# Patient Record
Sex: Female | Born: 1965 | Race: White | Hispanic: No | Marital: Married | Smoking: Never smoker
Health system: Southern US, Community
[De-identification: ages and names within clinical notes are randomized; demographics above are authoritative.]

## PROBLEM LIST (undated history)

## (undated) DIAGNOSIS — Z9889 Other specified postprocedural states: Secondary | ICD-10-CM

## (undated) DIAGNOSIS — K219 Gastro-esophageal reflux disease without esophagitis: Secondary | ICD-10-CM

## (undated) DIAGNOSIS — R112 Nausea with vomiting, unspecified: Secondary | ICD-10-CM

## (undated) DIAGNOSIS — J45909 Unspecified asthma, uncomplicated: Secondary | ICD-10-CM

## (undated) DIAGNOSIS — D649 Anemia, unspecified: Secondary | ICD-10-CM

## (undated) DIAGNOSIS — E119 Type 2 diabetes mellitus without complications: Secondary | ICD-10-CM

## (undated) DIAGNOSIS — E039 Hypothyroidism, unspecified: Secondary | ICD-10-CM

## (undated) DIAGNOSIS — E079 Disorder of thyroid, unspecified: Secondary | ICD-10-CM

## (undated) HISTORY — PX: OTHER SURGICAL HISTORY: SHX169

## (undated) HISTORY — PX: COLONOSCOPY: SHX174

## (undated) HISTORY — PX: BACK SURGERY: SHX140

## (undated) HISTORY — PX: FOOT SURGERY: SHX648

## (undated) HISTORY — PX: DG FOREARM RIGHT (ARMC HX): HXRAD1525

## (undated) HISTORY — PX: TONSILLECTOMY: SUR1361

---

## 2001-12-28 ENCOUNTER — Encounter: Payer: Self-pay | Admitting: Neurosurgery

## 2001-12-28 ENCOUNTER — Ambulatory Visit (HOSPITAL_COMMUNITY): Admission: RE | Admit: 2001-12-28 | Discharge: 2001-12-29 | Payer: Self-pay | Admitting: Neurosurgery

## 2006-10-11 ENCOUNTER — Emergency Department: Payer: Self-pay | Admitting: Emergency Medicine

## 2006-10-26 ENCOUNTER — Encounter: Admission: RE | Admit: 2006-10-26 | Discharge: 2006-10-26 | Payer: Self-pay | Admitting: Neurological Surgery

## 2006-10-27 ENCOUNTER — Ambulatory Visit: Payer: Self-pay

## 2007-01-11 ENCOUNTER — Ambulatory Visit: Payer: Self-pay | Admitting: *Deleted

## 2007-01-11 ENCOUNTER — Inpatient Hospital Stay (HOSPITAL_COMMUNITY): Admission: RE | Admit: 2007-01-11 | Discharge: 2007-01-15 | Payer: Self-pay | Admitting: Neurological Surgery

## 2007-10-28 ENCOUNTER — Ambulatory Visit: Payer: Self-pay

## 2008-11-23 ENCOUNTER — Ambulatory Visit: Payer: Self-pay

## 2009-08-16 ENCOUNTER — Ambulatory Visit: Payer: Self-pay | Admitting: Internal Medicine

## 2009-10-11 ENCOUNTER — Ambulatory Visit: Payer: Self-pay | Admitting: Unknown Physician Specialty

## 2009-10-16 ENCOUNTER — Ambulatory Visit: Payer: Self-pay | Admitting: Unknown Physician Specialty

## 2009-12-20 ENCOUNTER — Ambulatory Visit: Payer: Self-pay

## 2010-06-18 NOTE — Op Note (Signed)
[name], [name]                 ACCOUNT NO.:  [id number]   MEDICAL RECORD NO.:  [id number]          PATIENT TYPE:  INP   LOCATION:  3172                         FACILITY:  [facility]   PHYSICIAN:  Balinda Quails, M.D.    DATE OF BIRTH:  1965/10/13   DATE OF PROCEDURE:  01/11/2007  DATE OF DISCHARGE:                               OPERATIVE REPORT   SURGEON:  Denman George, M.D.   CO-SURGEON:  Stefani Dama M.D.   ANESTHETIC:  Is general endotracheal.   PREOPERATIVE DIAGNOSIS:  L5-S1 degenerative disk disease.   POSTOPERATIVE DIAGNOSIS:  L5-S1 degenerative disk disease.   PROCEDURE:  Right anterior retroperitoneal exposure L5-S1 for L5-S1  anterior lumbar interbody fusion (ALIF).   CLINICAL NOTE:  Suzanne Munoz is a 46 year old female with chronic back  pain and L5-S1 degenerative disk disease.  Scheduled today to undergo L5-  S1 ALIF.  The patient was seen preoperatively in the holding area.  No  contraindications to surgery.  No history of DVT or pulmonary embolus.  No history of major abdominal surgery.  Two plus dorsalis pedis and  posterior tibial pulses present preoperatively.   The patient was explained the details of the operative procedure.  Potential risks of the operative procedure were reviewed with a major  complication rated 1-2%.  Complications include but are not limited to  major vessel injury, thrombosis, limb ischemia, DVT or pulmonary  embolus, ureter injury, nerve injury, and transfusion risk.   OPERATIVE PROCEDURE:  The patient brought to the operating room in  stable condition.  Placed under general endotracheal anesthesia.  Pulse  oximetry placed on the left foot.  Measured 100% throughout the  operative procedure.   In the supine position, the abdomen was prepped and draped in sterile  fashion.  Using lateral projection fluoroscopy, L5-S1 level was marked  on the anterior abdominal wall.   Right lower quadrant transverse skin incision made.   Subcutaneous tissue  by electrocautery.  Anterior rectus sheath incised from midline to  lateral margin of the right rectus muscle.  Superior and inferior  anterior rectus sheath flaps were created.  The rectus muscle mobilized  medially, taking along the inferior epigastric vessels.  Retroperitoneal  space entered.  The psoas muscle and genitofemoral nerve were  identified, nerve left on the muscle.  The left external and common  iliac arteries identified.  These were cleared bluntly, sweeping the  structures medially including the ureter.  The L5-S1 disk space was  easily palpated.  The presacral tissues were bluntly pushed off of the  L5-S1 disk.  The fascial plane anterior to the disk was scored with  Bovie cautery.  Blunt dissection then used to push the structures off  the disk from right to left.  The small middle sacral vessels were  controlled with bipolar cautery.  The disk fully exposed from right-to-  left.   The [facility] retractor was then assembled, and using reversed lip  blades on the lateral margins of the L5-S1 vertebral bodies, disk was  exposed.  Malleable retractor was placed inferiorly and superiorly.  The disk was fully exposed for Dr. Danielle Dess to complete ALIF.  At  completion of ALIF, retractors were removed.  There was no evidence of  bleeding.  Pulse oximetry remained 100% in left foot.   The anterior rectus sheath was then closed with running 0 Vicryl suture.  Deep subcutaneous layer closed with running 2-0 Vicryl suture.  Superficial subcutaneous layer with running 3-0 Vicryl suture.  Skin  closed with 4-0 Monocryl.  Dermabond applied.   At termination of the operative procedure, there were 2+ posterior  tibial pulses present bilaterally.  No apparent complications.  The  patient remained stable throughout the operative procedure.  Transferred  to recovery room in stable condition.      Balinda Quails, M.D.  Electronically Signed     PGH/MEDQ   D:  01/11/2007  T:  01/11/2007  Job:  [id number]

## 2010-06-18 NOTE — Op Note (Signed)
Suzanne, Munoz                 ACCOUNT NO.:  [id number]   MEDICAL RECORD NO.:  [id number]          PATIENT TYPE:  INP   LOCATION:  3172                         FACILITY:  [facility]   PHYSICIAN:  Stefani Dama, M.D.  DATE OF BIRTH:  1965-06-24   DATE OF PROCEDURE:  01/11/2007  DATE OF DISCHARGE:                               OPERATIVE REPORT   PREOPERATIVE DIAGNOSIS:  Recurrent herniated nucleus pulposus, L5-S1,  with left lumbar radiculopathy.   POSTOPERATIVE DIAGNOSES:  1. Recurrent herniated nucleus pulposus, L5-S1, with left lumbar      radiculopathy.  2. Spondylosis, L5-S1.   INDICATIONS FOR PROCEDURE:  Suzanne Munoz is a 45 year old individual  who has had significant previous back and left lower extremity pain.  She has evidence of recurrent extraforaminal disk protrusion at the L5-  S1 level.  She has failed previous attempts at conservative management  and had a laminectomy and diskectomy for L5-S1 disk herniation in 2003.  She has been advised regarding anterior lumbar interbody decompression  and arthrodesis.  This procedure is now be performed.   PROCEDURE:  The patient was brought to the operating room and placed on  the table in the supine position.  After the smooth induction of general  endotracheal anesthesia, the procedure was started by Dr. Liliane Bade who  performed an anterior retroperitoneal approach to the L5-S1 space.  He  will dictate that portion separately.  When the anterior space of L5-S1  was confirmed radiographically with the needle placed in the disk, then  a diskectomy was performed.  Prior to the diskectomy, a bone marrow  aspirate was obtained from the L5 vertebra using the Jamshidi needle  being placed into the center portion of the vertebra and aspirating a  total of 10 mL of bone marrow aspirate.  The needle was withdrawn.  Gelfoam was placed over the bleeding site, and the disk space was then  opened with a #15 blade and a combination of  Kerrison rongeurs was used  to evacuate substantially degenerated disk at the L5-S1 space.  As the  region of the posterior longitudinal ligament was reached on the left  side, there was noted to be significant scar.  This was taken up very  carefully, and ultimately the dura was exposed on the left lateral  aspect of the common dural tube and the takeoff of the L5 nerve root was  also exposed.  Scar was encountered in this area and this was carefully  separated to allow decompression of the lateral aspect of the L5 nerve  root and the common dural tube.  Once this was accomplished, hemostasis  was achieved in this area.  The disk was removed out to the right side  and decompression here was obtained but the ligament was left intact.  Hemostasis was then obtained in the epidural space, and a high-speed bur  was used to fashion the endplates and remove any remnants of  cartilaginous material from the endplates themselves.  A curette was  also used to roughen the endplates and make sure that there was good  bleeding bony contact from the subcortical bone in the midportion of the  disk space.  Then a trial spacer was used, and it was felt that a 13-mm  tall, 30 x 38-mm 12-degree spacer would fit best.  This was then  prepared with a small amount of Infuse was placed into the center of the  spacer, and either side of the spacer was packed with Vitoss bone pack  that was soaked with the bone marrow aspirate.  The spacer was then  placed into the squid and the squid was used to apply the spacer into  the interspace at the L5-S1 space.  Fluoroscopy was obtained to make  sure that there was good placement of the spacer in the midline.  It  required some modification and movement once the midline was secured,  and the spacer was placed.  A 12 and 4 20-mm locking screws were placed  into the anterior titanium plate, first by passing in the awl to  perforate the bony endplate and then placing the  screws and locking them  down sequentially.  Final radiographs were obtained in the AP and  lateral projection.  Remnants of  the bone graft were then packed into the lateral gutters.  Hemostasis  was checked in the soft tissues, and the retractors were sequentially  removed.  Dr. Madilyn Fireman then performed closure of the fascia to the skin.  Blood loss for the procedure was estimated at less than 100 mL.      Stefani Dama, M.D.  Electronically Signed     HJE/MEDQ  D:  01/11/2007  T:  01/11/2007  Job:  [id number]

## 2010-06-21 NOTE — Op Note (Signed)
NAMECORTNIE, Suzanne Munoz                           ACCOUNT NO.:  [id number]   MEDICAL RECORD NO.:  [id number]                   PATIENT TYPE:  OIB   LOCATION:  NA                                   FACILITY:  [facility]   PHYSICIAN:  Reinaldo Meeker, M.D.              DATE OF BIRTH:  [birth year]   DATE OF PROCEDURE:  12/28/2001  DATE OF DISCHARGE:                                 OPERATIVE REPORT   PREOPERATIVE DIAGNOSIS:  Herniated disk, L5-S1 left.   POSTOPERATIVE DIAGNOSIS:  Herniated disk, L5-S1 left.   OPERATION PERFORMED:  Left L5-S1 interlaminal laminotomy for excision of  herniated disk with the operating microscope.   SECONDARY PROCEDURE:  Microdissection of L5-S1 disk and S1 nerve root.   SURGEON:  Reinaldo Meeker, M.D.   ASSISTANT:  Donalee Citrin, M.D.   DESCRIPTION OF PROCEDURE:  After being placed in the prone position, the  patient's back was prepped and draped in the usual sterile fashion.  A  localizing x-ray was taken prior to incision to identify the appropriate  level.  A midline incision was made above the spinous processes of L5 and  S1.  Using Bovie cutting current, the incision was carried down to the  spinous processes.  Subperiosteal dissection was then carried out on the  left sided spinous processes and lamina and the McCullough self-retaining  retractor was placed for exposure.  A second x-ray was taken to confirm  approach to the L5-S1 level and this was correct.  Using a high speed drill  the inferior one third of the L5 lamina and the medial one third of the  facet joint removed.  It was then used to remove the superior one third of  the S1 lamina.  Residual bone and ligamentum flavum were removed in a  piecemeal fashion.  The microscope was draped and brought into the field and  used for the remainder of the case.  Using microdissection technique, the  lateral aspect of the thecal sac and S1 nerve were identified.  Bovie  coagulation was carried down to the  floor of the canal to identify the L5-S1  disk.  After slightly retracting the nerve root medially, a very large  fragment of disk material was found directly beneath the nerve root.  The  annulus was incised and the fragment was removed.  At this point a very  thorough disk space clean out was carried out.  At the same time great care  was taken to avoid injury to the neural elements and this was successfully  done.  At this point inspection was carried out in all directions for any  evidence of residual compression and none could be identified.  Large  amounts of irrigation were carried out.  Any bleeding was controlled with  bipolar coagulation and Gelfoam.  The wound was then closed using  interrupted Vicryl on the muscle, fascia, subcutaneous  and subcuticular  tissues and staples on the skin.  Sterile dressing was then applied.  The  patient was extubated and taken to the recovery room in stable condition.                                                 Reinaldo Meeker, M.D.    ROK/MEDQ  D:  12/28/2001  T:  12/28/2001  Job:  [id number]

## 2010-06-21 NOTE — Discharge Summary (Signed)
NAMEJAEANNA, Suzanne Munoz                 ACCOUNT NO.:  [id number]   MEDICAL RECORD NO.:  [id number]          PATIENT TYPE:  INP   LOCATION:  3006                         FACILITY:  [facility]   PHYSICIAN:  Stefani Dama, M.D.  DATE OF BIRTH:  March 04, 1965   DATE OF ADMISSION:  01/11/2007  DATE OF DISCHARGE:  01/15/2007                               DISCHARGE SUMMARY   ADMITTING DIAGNOSIS:  Herniated nucleus pulposus and spondylosis, L5-S1,  recurrent with lumbar radiculopathy.   DISCHARGE AND FINAL DIAGNOSIS:  1. Herniated nucleus pulposus, L5-S1, recurrent with lumbar      radiculopathy.  2. Postoperative ileus.   CONDITION ON DISCHARGE:  Improving.   HOSPITAL COURSE:  Suzanne Munoz is a 45 year old individual who was had  recurrent herniated nucleus pulposus at L5-S1.  She had significant  difficulty with chronic radiculopathy and was failing conservative  efforts at management.  She was advised regarding need for surgical  decompression and stabilization, and this was performed on January 11, 2007 via an anterior lumbar interbody decompression arthrodesis.  She  tolerated the procedure well.  However, during the early postoperative  phase, it was evident that she had a paralytic ileus.  This was treated  by maintaining her on IV fluids and maintaining her n.p.o.  She was  ambulated, and after the third postoperative day, her bowels moved, and  she had resolution of her ileus.  She was given instructions as to her  postoperative care.  Foley catheter was discontinued after the second  hospital day.  The patient's incision remained clean and dry.  She was  given a prescription for hydrocodone #60 without refills and Valium 5 mg  #40 without refills.  She also will be giving herself injections of  Lovenox 40 mg q.a.m. for additional 6 days.   CONDITION ON DISCHARGE:  Improved.      Stefani Dama, M.D.  Electronically Signed     HJE/MEDQ  D:  02/11/2007  T:  02/11/2007  Job:   [id number]

## 2010-11-11 LAB — CBC
HCT: 33.6 — ABNORMAL LOW
HCT: 40.5
Hemoglobin: 11.7 — ABNORMAL LOW
Hemoglobin: 14
MCHC: 34.5
MCHC: 34.9
MCV: 85.9
Platelets: 296
RDW: 12.4
RDW: 12.5

## 2010-11-11 LAB — BASIC METABOLIC PANEL
BUN: 15
BUN: 9
CO2: 24
CO2: 27
Chloride: 108
Glucose, Bld: 118 — ABNORMAL HIGH
Glucose, Bld: 191 — ABNORMAL HIGH
Potassium: 4.1
Potassium: 4.6
Sodium: 137
Sodium: 139

## 2010-12-31 ENCOUNTER — Ambulatory Visit: Payer: Self-pay

## 2011-03-20 DIAGNOSIS — IMO0001 Reserved for inherently not codable concepts without codable children: Secondary | ICD-10-CM | POA: Insufficient documentation

## 2011-06-23 ENCOUNTER — Emergency Department: Payer: Self-pay | Admitting: Emergency Medicine

## 2011-06-23 LAB — COMPREHENSIVE METABOLIC PANEL
BUN: 19 mg/dL — ABNORMAL HIGH (ref 7–18)
Bilirubin,Total: 0.3 mg/dL (ref 0.2–1.0)
Calcium, Total: 8.9 mg/dL (ref 8.5–10.1)
Chloride: 105 mmol/L (ref 98–107)
Creatinine: 1.09 mg/dL (ref 0.60–1.30)
Glucose: 118 mg/dL — ABNORMAL HIGH (ref 65–99)
Osmolality: 281 (ref 275–301)
SGPT (ALT): 43 U/L
Sodium: 139 mmol/L (ref 136–145)
Total Protein: 7.3 g/dL (ref 6.4–8.2)

## 2011-06-23 LAB — CBC
HCT: 36.7 % (ref 35.0–47.0)
Platelet: 269 10*3/uL (ref 150–440)
RBC: 4.36 10*6/uL (ref 3.80–5.20)

## 2011-09-15 ENCOUNTER — Ambulatory Visit: Payer: Self-pay | Admitting: Unknown Physician Specialty

## 2012-01-05 ENCOUNTER — Ambulatory Visit: Payer: Self-pay

## 2012-06-23 ENCOUNTER — Ambulatory Visit: Payer: Self-pay | Admitting: Family Medicine

## 2013-01-05 ENCOUNTER — Ambulatory Visit: Payer: Self-pay | Admitting: Obstetrics and Gynecology

## 2013-05-12 DIAGNOSIS — E119 Type 2 diabetes mellitus without complications: Secondary | ICD-10-CM | POA: Insufficient documentation

## 2013-05-12 DIAGNOSIS — E039 Hypothyroidism, unspecified: Secondary | ICD-10-CM | POA: Insufficient documentation

## 2013-05-12 DIAGNOSIS — E785 Hyperlipidemia, unspecified: Secondary | ICD-10-CM | POA: Insufficient documentation

## 2013-05-12 DIAGNOSIS — IMO0001 Reserved for inherently not codable concepts without codable children: Secondary | ICD-10-CM | POA: Insufficient documentation

## 2013-05-12 DIAGNOSIS — K589 Irritable bowel syndrome without diarrhea: Secondary | ICD-10-CM | POA: Insufficient documentation

## 2013-05-12 DIAGNOSIS — R12 Heartburn: Secondary | ICD-10-CM | POA: Insufficient documentation

## 2013-07-21 DIAGNOSIS — M7062 Trochanteric bursitis, left hip: Secondary | ICD-10-CM | POA: Insufficient documentation

## 2013-07-21 DIAGNOSIS — M5116 Intervertebral disc disorders with radiculopathy, lumbar region: Secondary | ICD-10-CM | POA: Insufficient documentation

## 2013-07-21 DIAGNOSIS — M5136 Other intervertebral disc degeneration, lumbar region: Secondary | ICD-10-CM | POA: Insufficient documentation

## 2013-07-21 DIAGNOSIS — M5416 Radiculopathy, lumbar region: Secondary | ICD-10-CM | POA: Insufficient documentation

## 2013-07-21 DIAGNOSIS — M51369 Other intervertebral disc degeneration, lumbar region without mention of lumbar back pain or lower extremity pain: Secondary | ICD-10-CM | POA: Insufficient documentation

## 2013-10-19 DIAGNOSIS — E611 Iron deficiency: Secondary | ICD-10-CM | POA: Insufficient documentation

## 2013-10-19 DIAGNOSIS — E538 Deficiency of other specified B group vitamins: Secondary | ICD-10-CM | POA: Insufficient documentation

## 2013-10-19 DIAGNOSIS — E669 Obesity, unspecified: Secondary | ICD-10-CM | POA: Insufficient documentation

## 2013-12-20 DIAGNOSIS — M169 Osteoarthritis of hip, unspecified: Secondary | ICD-10-CM | POA: Insufficient documentation

## 2013-12-20 DIAGNOSIS — M1612 Unilateral primary osteoarthritis, left hip: Secondary | ICD-10-CM | POA: Insufficient documentation

## 2014-02-21 ENCOUNTER — Ambulatory Visit: Payer: Self-pay | Admitting: Obstetrics and Gynecology

## 2014-10-19 DIAGNOSIS — M25559 Pain in unspecified hip: Secondary | ICD-10-CM | POA: Insufficient documentation

## 2014-10-19 DIAGNOSIS — M79605 Pain in left leg: Secondary | ICD-10-CM | POA: Insufficient documentation

## 2014-10-19 DIAGNOSIS — R102 Pelvic and perineal pain: Secondary | ICD-10-CM | POA: Insufficient documentation

## 2014-12-19 DIAGNOSIS — E119 Type 2 diabetes mellitus without complications: Secondary | ICD-10-CM | POA: Insufficient documentation

## 2015-02-09 ENCOUNTER — Encounter: Payer: Self-pay | Admitting: Sports Medicine

## 2015-02-09 ENCOUNTER — Ambulatory Visit (INDEPENDENT_AMBULATORY_CARE_PROVIDER_SITE_OTHER): Payer: BLUE CROSS/BLUE SHIELD | Admitting: Sports Medicine

## 2015-02-09 ENCOUNTER — Ambulatory Visit (INDEPENDENT_AMBULATORY_CARE_PROVIDER_SITE_OTHER): Payer: BLUE CROSS/BLUE SHIELD

## 2015-02-09 VITALS — BP 153/78 | HR 65 | Resp 16

## 2015-02-09 DIAGNOSIS — E119 Type 2 diabetes mellitus without complications: Secondary | ICD-10-CM

## 2015-02-09 DIAGNOSIS — M779 Enthesopathy, unspecified: Secondary | ICD-10-CM | POA: Diagnosis not present

## 2015-02-09 DIAGNOSIS — M79675 Pain in left toe(s): Secondary | ICD-10-CM | POA: Diagnosis not present

## 2015-02-09 DIAGNOSIS — M205X2 Other deformities of toe(s) (acquired), left foot: Secondary | ICD-10-CM | POA: Diagnosis not present

## 2015-02-09 MED ORDER — TRIAMCINOLONE ACETONIDE 10 MG/ML IJ SUSP: 10.0000 mg | Freq: Once | INTRAMUSCULAR | Status: DC

## 2015-02-09 NOTE — Progress Notes (Signed)
Patient ID: Suzanne Munoz, female   DOB: 07/15/1965, 49 y.o.   MRN: [id number] Subjective: Suzanne Munoz is a 49 y.o. diabetic female patient who presents to office for evaluation of Left big toe joint pain. Patient complains of progressive pain/throbbing especially over the last few months that is worse with motion and when she wears sneakers. 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 Tried Ibuprofen. Patient denies any other pedal complaints.   A1c 7.5  Patient Active Problem List   Diagnosis Date Noted  . Controlled type 2 diabetes mellitus without complication (HCC) 12/19/2014  . Arthralgia of hip 10/19/2014  . Left leg pain 10/19/2014  . Adnexal pain 10/19/2014  . Degenerative arthritis of hip 12/20/2013  . B12 deficiency 10/19/2013  . Iron deficiency 10/19/2013  . Adiposity 10/19/2013  . Degeneration of intervertebral disc of lumbar region 07/21/2013  . Neuritis or radiculitis due to rupture of lumbar intervertebral disc 07/21/2013  . Trochanteric bursitis of left hip 07/21/2013  . Diabetes mellitus (HCC) 05/12/2013  . Brash 05/12/2013  . HLD (hyperlipidemia) 05/12/2013  . Adult hypothyroidism 05/12/2013  . Adaptive colitis 05/12/2013  . Reflux 03/20/2011   No current outpatient prescriptions on file prior to visit.   No current facility-administered medications on file prior to visit.   Allergies  Allergen Reactions  . Desipramine Other (See Comments)    Sweats  . Dexlansoprazole Other (See Comments)    Increase BG  . Rosuvastatin Other (See Comments)    Heartburn   Objective:  General: Alert and oriented x3 in no acute distress  Dermatology: No open lesions bilateral lower extremities, no webspace macerations, no ecchymosis bilateral, all nails x 10 are well manicured.  Vascular: Dorsalis Pedis and Posterior Tibial pedal pulses 2/4, Capillary Fill Time 3 seconds, (+) pedal hair growth  bilateral, no edema bilateral lower extremities, Temperature gradient within normal limits.  Neurology: Gross sensation intact via light touch bilateral, Protective sensation intact  with Semmes Weinstein Monofilament to all pedal sites. (-) Tinels sign left foot.   Musculoskeletal: Mild tenderness with palpation left 1st MTPJ with limitation 30 DF and 10PF on left without crepitus with range of motion, all other joints within normal limits. No other gross pedal deformities.   Xrays  Left Foot 3 views    Impression: Normal osseous mineralization, Joint space narrowing at 1st MTPJ with squaring and osteophytes/dorsal flag sign, calcaneal heel spur. Mild 1st MTPJ soft tissue swelling. No foreign body.        Assessment and Plan: Problem List Items Addressed This Visit    None    Visit Diagnoses    Pain of toe of left foot    -  Primary    Relevant Medications    triamcinolone acetonide (KENALOG) 10 MG/ML injection 10 mg    Other Relevant Orders    DG Foot Complete Left    Hallux limitus of left foot        Relevant Medications    triamcinolone acetonide (KENALOG) 10 MG/ML injection 10 mg    Diabetes mellitus without complication (HCC)        Relevant Medications    lisinopril (PRINIVIL,ZESTRIL) 5 MG tablet    metFORMIN (GLUCOPHAGE-XR) 500 MG 24 hr tablet    simvastatin (ZOCOR) 40 MG tablet    Capsulitis        Relevant Medications    triamcinolone acetonide (KENALOG) 10 MG/ML injection 10 mg       -  Complete examination performed -Xrays reviewed -Discussed treatement options; discussed Hallux limitus/rigidus;conservative and  Surgical management; risks, benefits, alternatives discussed. All patient's questions answered. -After verbal consent, injected into left 1st MTPJ for symptomatic relief 1cc lidocaine plain, 1cc marcaine plain, 0.5cc dexamethasone without complication.   -Dispensed met pad and instructed on use -Recommend continue with good supportive shoes and OTC inserts.   -Daily foot inspection in setting of diabetes  -Patient to return to office in 3 weeks or sooner if condition worsens.  Landis Martins, DPM

## 2015-02-09 NOTE — Patient Instructions (Signed)
Hallux Rigidus Hallux rigidus is a condition involving pain and a loss of motion of the first (big) toe. The pain gets worse with lifting up (extension) of the toe. This is usually due to arthritic bony bumps (spurring) of the joint at the base of the big toe.  SYMPTOMS   Pain, with lifting up of the toe.  Tenderness over the joint where the big toe meets the foot.  Redness, swelling, and warmth over the top of the base of the big toe (sometimes).  Foot pain, stiffness, and limping. CAUSES  Hallux rigidus is caused by arthritis of the joint where the big toe meets the foot. The arthritis creates a bone spur that pinches the soft tissues when the toe is extended. RISK INCREASES WITH:  Tight shoes with a narrow toe box.  Family history of foot problems.  Gout and rheumatoid and psoriatic arthritis.  History of previous toe injury, including "turf toe."  Long first toe, flat feet, and other big toe bony bumps.  Arthritis of the big toe. PREVENTION   Wear wide-toed shoes that fit well.  Tape the big toe to reduce motion and to prevent pinching of the tissues between the bone.  Maintain physical fitness:  Foot and ankle flexibility.  Muscle strength and endurance. PROGNOSIS  This condition can usually be managed with proper treatment. However, surgery is typically required to prevent the problem from recurring.  RELATED COMPLICATIONS  Injury to other areas of the foot or ankle, caused by abnormal walking in an attempt to avoid the pain felt when walking normally. TREATMENT Treatment first involves stopping the activities that aggravate your symptoms. Ice and medicine can be used to reduce the pain and inflammation. Modifications to shoes may help reduce pain, including wearing stiff-soled shoes, shoes with a wide toe box, inserting a padded donut to relieve pressure on top of the joint, or wearing an arch support. Corticosteroid injections may be given to reduce inflammation. If  nonsurgical treatment is unsuccessful, surgery may be needed. Surgical options include removing the arthritic bony spur, cutting a bone in the foot to change the arc of motion (allowing the toe to extend more), or fusion of the joint (eliminating all motion in the joint at the base of the big toe).  MEDICATION   If pain medicine is needed, nonsteroidal anti-inflammatory medicines (aspirin and ibuprofen), or other minor pain relievers (acetaminophen), are often advised.  Do not take pain medicine for 7 days before surgery.  Prescription pain relievers are usually prescribed only after surgery. Use only as directed and only as much as you need.  Ointments for arthritis, applied to the skin, may give some relief.  Injections of corticosteroids may be given to reduce inflammation. HEAT AND COLD  Cold treatment (icing) relieves pain and reduces inflammation. Cold treatment should be applied for 10 to 15 minutes every 2 to 3 hours, and immediately after activity that aggravates your symptoms. Use ice packs or an ice massage.  Heat treatment may be used before performing the stretching and strengthening activities prescribed by your caregiver, physical therapist, or athletic trainer. Use a heat pack or a warm water soak. SEEK MEDICAL CARE IF:   Symptoms get worse or do not improve in 2 weeks, despite treatment.  After surgery you develop fever, increasing pain, redness, swelling, drainage of fluids, bleeding, or increasing warmth.  New, unexplained symptoms develop. (Drugs used in treatment may produce side effects.)   This information is not intended to replace advice given to   you by your health care provider. Make sure you discuss any questions you have with your health care provider.   Document Released: 01/20/2005 Document Revised: 02/10/2014 Document Reviewed: 05/04/2008 Elsevier Interactive Patient Education 2016 [employer]  

## 2015-02-09 NOTE — Progress Notes (Deleted)
   Subjective:    Patient ID: Suzanne Munoz, female    DOB: 1966/01/27, 50 y.o.   MRN: [id number]  HPI    Review of Systems  Gastrointestinal: Positive for nausea, diarrhea and constipation.  Endocrine: Positive for cold intolerance, heat intolerance and polyphagia.  Musculoskeletal: Positive for back pain and arthralgias.  Hematological: Bruises/bleeds easily.  All other systems reviewed and are negative.      Objective:   Physical Exam        Assessment & Plan:

## 2015-03-02 ENCOUNTER — Encounter: Payer: Self-pay | Admitting: Sports Medicine

## 2015-03-02 ENCOUNTER — Ambulatory Visit (INDEPENDENT_AMBULATORY_CARE_PROVIDER_SITE_OTHER): Payer: BLUE CROSS/BLUE SHIELD | Admitting: Sports Medicine

## 2015-03-02 VITALS — BP 121/70 | HR 81 | Resp 16

## 2015-03-02 DIAGNOSIS — M79675 Pain in left toe(s): Secondary | ICD-10-CM | POA: Diagnosis not present

## 2015-03-02 DIAGNOSIS — M779 Enthesopathy, unspecified: Secondary | ICD-10-CM | POA: Diagnosis not present

## 2015-03-02 DIAGNOSIS — E119 Type 2 diabetes mellitus without complications: Secondary | ICD-10-CM

## 2015-03-02 DIAGNOSIS — M205X2 Other deformities of toe(s) (acquired), left foot: Secondary | ICD-10-CM | POA: Diagnosis not present

## 2015-03-02 MED ORDER — MELOXICAM 15 MG PO TABS: 15.0000 mg | ORAL_TABLET | Freq: Every day | ORAL | Status: DC

## 2015-03-02 NOTE — Progress Notes (Signed)
Patient ID: Suzanne Munoz, female   DOB: Nov 02, 1965, 50 y.o.   MRN: [id number]  Subjective: Suzanne Munoz is a 50 y.o. diabetic female patient who returns to office for evaluation of Left big toe joint pain. Patient states that pain is better and that she has more flexibility to the big toe after the injection. States that the pain is less intense and less frequent than before. States that met pads really helped. Patient denies any other pedal complaints.   A1c 7.9 increased from previous and FBS in range of 170s  Patient Active Problem List   Diagnosis Date Noted  . Controlled type 2 diabetes mellitus without complication ([place]) 99/37/1696  . Arthralgia of hip 10/19/2014  . Left leg pain 10/19/2014  . Adnexal pain 10/19/2014  . Degenerative arthritis of hip 12/20/2013  . B12 deficiency 10/19/2013  . Iron deficiency 10/19/2013  . Adiposity 10/19/2013  . Degeneration of intervertebral disc of lumbar region 07/21/2013  . Neuritis or radiculitis due to rupture of lumbar intervertebral disc 07/21/2013  . Trochanteric bursitis of left hip 07/21/2013  . Diabetes mellitus ([place]) 05/12/2013  . Brash 05/12/2013  . HLD (hyperlipidemia) 05/12/2013  . Adult hypothyroidism 05/12/2013  . Adaptive colitis 05/12/2013  . Reflux 03/20/2011   Current Outpatient Prescriptions on File Prior to Visit  Medication Sig Dispense Refill  . Cyanocobalamin (B-12 PO) Take by mouth.    . Fexofenadine HCl (ALLEGRA PO) Take by mouth.    . hyoscyamine (LEVSIN, ANASPAZ) 0.125 MG tablet TAKE 1 TABLET (0.125 MG TOTAL) BY MOUTH EVERY 4 (FOUR) HOURS AS NEEDED FOR CRAMPING.  3  . lisinopril (PRINIVIL,ZESTRIL) 5 MG tablet Take 5 mg by mouth daily.  1  . metFORMIN (GLUCOPHAGE-XR) 500 MG 24 hr tablet Take 1,000 mg by mouth 2 (two) times daily.  1  . ONE TOUCH ULTRA TEST test strip USE TO TEST BLOOD SUGAR DAILY  3  . polyethylene glycol powder (GLYCOLAX/MIRALAX) powder TAKE ALL AS ONCE FOR 1 DOSE AS DIRECTED  0   . simvastatin (ZOCOR) 40 MG tablet Take 40 mg by mouth at bedtime.  3  . SYNTHROID 137 MCG tablet TAKE 1 TABLET BY MOUTH 30-60 MINUTES BEFORE BREAKFAST WITH A FULL GLASS OF WATER  4   Current Facility-Administered Medications on File Prior to Visit  Medication Dose Route Frequency Provider Last Rate Last Dose  . triamcinolone acetonide (KENALOG) 10 MG/ML injection 10 mg  10 mg Other Once Landis Martins, DPM       Allergies  Allergen Reactions  . Desipramine Other (See Comments)    Sweats  . Dexlansoprazole Other (See Comments)    Increase BG  . Rosuvastatin Other (See Comments)    Heartburn   Objective:  General: Alert and oriented x3 in no acute distress  Dermatology: No open lesions bilateral lower extremities, no webspace macerations, no ecchymosis bilateral, all nails x 10 are well manicured.  Vascular: Dorsalis Pedis and Posterior Tibial pedal pulses 2/4, Capillary Fill Time 3 seconds, (+) pedal hair growth bilateral, no edema bilateral lower extremities, Temperature gradient within normal limits.  Neurology: Gross sensation intact via light touch bilateral, Protective sensation intact with Thornell Mule Monofilament to all pedal sites. (-) Tinels sign left foot.   Musculoskeletal: No tenderness with palpation left 1st MTPJ with limitation 30 DF and 10PF on left without crepitus on range of motion, all other joints within normal limits. No other gross pedal deformities.   Assessment and Plan: Problem List Items Addressed  This Visit    None    Visit Diagnoses    Pain of toe of left foot    -  Primary    Relevant Medications    meloxicam (MOBIC) 15 MG tablet    Hallux limitus of left foot        Relevant Medications    meloxicam (MOBIC) 15 MG tablet    Capsulitis        Relevant Medications    meloxicam (MOBIC) 15 MG tablet    Diabetes mellitus without complication (HCC)           -Complete examination performed -Re-educated patient and discussed treatement  options; discussed Hallux limitus/rigidus;conservative and  Surgical management; risks, benefits, alternatives discussed. All patient's questions answered. -No injection administered at this encounter  -Rx Meloxicam 55m PO daily with food -Gave patient more met pads and instructed on use -Recommend continue with good supportive shoes and OTC inserts.  -Daily foot inspection in setting of diabetes  -Patient to return to office in 6 weeks or sooner if condition worsens. May consider custom orthotics at next encounter.   TLandis Martins DPM

## 2015-04-17 ENCOUNTER — Ambulatory Visit: Payer: BLUE CROSS/BLUE SHIELD | Admitting: Sports Medicine

## 2015-04-20 ENCOUNTER — Encounter: Payer: Self-pay | Admitting: Sports Medicine

## 2015-04-20 ENCOUNTER — Ambulatory Visit (INDEPENDENT_AMBULATORY_CARE_PROVIDER_SITE_OTHER): Payer: 59 | Admitting: Sports Medicine

## 2015-04-20 DIAGNOSIS — M2040 Other hammer toe(s) (acquired), unspecified foot: Secondary | ICD-10-CM | POA: Diagnosis not present

## 2015-04-20 DIAGNOSIS — E119 Type 2 diabetes mellitus without complications: Secondary | ICD-10-CM

## 2015-04-20 DIAGNOSIS — M205X2 Other deformities of toe(s) (acquired), left foot: Secondary | ICD-10-CM

## 2015-04-20 DIAGNOSIS — M79675 Pain in left toe(s): Secondary | ICD-10-CM | POA: Diagnosis not present

## 2015-04-20 DIAGNOSIS — M779 Enthesopathy, unspecified: Secondary | ICD-10-CM

## 2015-04-20 DIAGNOSIS — L84 Corns and callosities: Secondary | ICD-10-CM | POA: Diagnosis not present

## 2015-04-20 NOTE — Progress Notes (Signed)
Patient ID: Suzanne Munoz, female   DOB: Feb 02, 1966, 50 y.o.   MRN: [id number] Subjective: Suzanne Munoz is a 50 y.o. diabetic female patient who presents to office for follow up evaluation of Left foot pain at big toe and now new pain at medial side of 5th toe on left. Patient states that big toe joint is better as long as she is in still sole shoe. States that her 5th toe bothers her most and she desires this to be looked at; she had her pedicurists look it. Patient denies any other pedal complaints.   FBS 120's and much more controlled since change in medications.   Patient Active Problem List   Diagnosis Date Noted  . Controlled type 2 diabetes mellitus without complication (HCC) 12/19/2014  . Arthralgia of hip 10/19/2014  . Left leg pain 10/19/2014  . Adnexal pain 10/19/2014  . Degenerative arthritis of hip 12/20/2013  . B12 deficiency 10/19/2013  . Iron deficiency 10/19/2013  . Adiposity 10/19/2013  . Degeneration of intervertebral disc of lumbar region 07/21/2013  . Neuritis or radiculitis due to rupture of lumbar intervertebral disc 07/21/2013  . Trochanteric bursitis of left hip 07/21/2013  . Diabetes mellitus (HCC) 05/12/2013  . Brash 05/12/2013  . HLD (hyperlipidemia) 05/12/2013  . Adult hypothyroidism 05/12/2013  . Adaptive colitis 05/12/2013  . Reflux 03/20/2011    Current Outpatient Prescriptions on File Prior to Visit  Medication Sig Dispense Refill  . Cyanocobalamin (B-12 PO) Take by mouth.    . Fexofenadine HCl (ALLEGRA PO) Take by mouth.    . hyoscyamine (LEVSIN, ANASPAZ) 0.125 MG tablet TAKE 1 TABLET (0.125 MG TOTAL) BY MOUTH EVERY 4 (FOUR) HOURS AS NEEDED FOR CRAMPING.  3  . lisinopril (PRINIVIL,ZESTRIL) 5 MG tablet Take 5 mg by mouth daily.  1  . meloxicam (MOBIC) 15 MG tablet Take 1 tablet (15 mg total) by mouth daily. 30 tablet 0  . metFORMIN (GLUCOPHAGE-XR) 500 MG 24 hr tablet Take 1,000 mg by mouth 2 (two) times daily.  1  . ONE TOUCH ULTRA  TEST test strip USE TO TEST BLOOD SUGAR DAILY  3  . polyethylene glycol powder (GLYCOLAX/MIRALAX) powder TAKE ALL AS ONCE FOR 1 DOSE AS DIRECTED  0  . simvastatin (ZOCOR) 40 MG tablet Take 40 mg by mouth at bedtime.  3  . SYNTHROID 137 MCG tablet TAKE 1 TABLET BY MOUTH 30-60 MINUTES BEFORE BREAKFAST WITH A FULL GLASS OF WATER  4   Current Facility-Administered Medications on File Prior to Visit  Medication Dose Route Frequency Provider Last Rate Last Dose  . triamcinolone acetonide (KENALOG) 10 MG/ML injection 10 mg  10 mg Other Once Asencion Islam, DPM        Allergies  Allergen Reactions  . Desipramine Other (See Comments)    Sweats  . Dexlansoprazole Other (See Comments)    Increase BG  . Rosuvastatin Other (See Comments)    Heartburn    Objective:  General: Alert and oriented x3 in no acute distress  Dermatology: Minimall hyperkeratotic lesion overlying 5 PIPJ dorsal lateral aspect on left. There is also hyperkeratotic lesions at the medial 5th toe nail suggestive of lister corn that is tender to touch. No open lesions bilateral lower extremities, no webspace macerations, no ecchymosis bilateral, all nails x 10 are well manicured.  Vascular: Dorsalis Pedis and Posterior Tibial pedal pulses 2/4, Capillary Fill Time 3 seconds,(+) pedal hair growth bilateral, no edema bilateral lower extremities, Temperature gradient within normal limits.  Neurology: Michaell CowingGross sensation intact via light touch bilateral, Protective sensation intact  with Semmes Weinstein Monofilament to all pedal sites, Position sense intact, vibratory intact bilateral, Deep tendon reflexes within normal limits bilateral, No babinski sign present bilateral.   Musculoskeletal: Semi-flexible varus hammertoe 4-5 on left with Mild tenderness with palpation at medial 5th nail fold at keratosis, Left 1st MTPJ limited pain free range of motion 30 DF and 10 PF, all other joint range of motion is within normal limits, No pain with  calf compression bilateral.  Strength within normal limits in all groups bilateral.        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 Gave silicone toe cap and instructed patient on use. Advised close monitoring if recurs to consider hammertoe correction -Advised closed monitoring of Left big toe joint and to continue with good supportive shoes that have a firm/stiff sole.  Encouraged patient to consider orthotics  -Patient to return to office as needed or sooner if condition worsens.  Asencion Islamitorya Fedra Lanter, DPM

## 2015-07-03 ENCOUNTER — Encounter: Payer: Self-pay | Admitting: Sports Medicine

## 2015-07-03 ENCOUNTER — Ambulatory Visit (INDEPENDENT_AMBULATORY_CARE_PROVIDER_SITE_OTHER): Payer: 59 | Admitting: Sports Medicine

## 2015-07-03 VITALS — BP 133/71 | HR 74 | Resp 16

## 2015-07-03 DIAGNOSIS — E119 Type 2 diabetes mellitus without complications: Secondary | ICD-10-CM

## 2015-07-03 DIAGNOSIS — M205X2 Other deformities of toe(s) (acquired), left foot: Secondary | ICD-10-CM | POA: Diagnosis not present

## 2015-07-03 DIAGNOSIS — M2040 Other hammer toe(s) (acquired), unspecified foot: Secondary | ICD-10-CM | POA: Diagnosis not present

## 2015-07-03 DIAGNOSIS — L84 Corns and callosities: Secondary | ICD-10-CM | POA: Diagnosis not present

## 2015-07-03 DIAGNOSIS — M779 Enthesopathy, unspecified: Secondary | ICD-10-CM | POA: Diagnosis not present

## 2015-07-03 DIAGNOSIS — M79675 Pain in left toe(s): Secondary | ICD-10-CM | POA: Diagnosis not present

## 2015-07-03 MED ORDER — TRIAMCINOLONE ACETONIDE 10 MG/ML IJ SUSP: 10.0000 mg | Freq: Once | INTRAMUSCULAR | Status: DC

## 2015-07-03 NOTE — Progress Notes (Signed)
Patient ID: Suzanne Munoz, female   DOB: 30-Mar-1965, 50 y.o.   MRN: [id number]  Subjective: Suzanne Munoz is a 50 y.o. diabetic female patient who returns to office for follow up evaluation of Left foot pain at big toe and pain at medial side of 5th toe on left. Patient reports that she gets sharp pain still over the big toe that is worse with long walks and pain from rubbing at her 5th toe; tried spacer but lost it. Patient denies any other pedal complaints.   FBS 120's   Patient Active Problem List   Diagnosis Date Noted  . Controlled type 2 diabetes mellitus without complication (HCC) 12/19/2014  . Arthralgia of hip 10/19/2014  . Left leg pain 10/19/2014  . Adnexal pain 10/19/2014  . Degenerative arthritis of hip 12/20/2013  . B12 deficiency 10/19/2013  . Iron deficiency 10/19/2013  . Adiposity 10/19/2013  . Degeneration of intervertebral disc of lumbar region 07/21/2013  . Neuritis or radiculitis due to rupture of lumbar intervertebral disc 07/21/2013  . Trochanteric bursitis of left hip 07/21/2013  . Diabetes mellitus (HCC) 05/12/2013  . Brash 05/12/2013  . HLD (hyperlipidemia) 05/12/2013  . Adult hypothyroidism 05/12/2013  . Adaptive colitis 05/12/2013  . Reflux 03/20/2011    Current Outpatient Prescriptions on File Prior to Visit  Medication Sig Dispense Refill  . Cyanocobalamin (B-12 PO) Take by mouth.    . Fexofenadine HCl (ALLEGRA PO) Take by mouth.    . hyoscyamine (LEVSIN, ANASPAZ) 0.125 MG tablet TAKE 1 TABLET (0.125 MG TOTAL) BY MOUTH EVERY 4 (FOUR) HOURS AS NEEDED FOR CRAMPING.  3  . lisinopril (PRINIVIL,ZESTRIL) 5 MG tablet Take 5 mg by mouth daily.  1  . meloxicam (MOBIC) 15 MG tablet Take 1 tablet (15 mg total) by mouth daily. 30 tablet 0  . metFORMIN (GLUCOPHAGE-XR) 500 MG 24 hr tablet Take 1,000 mg by mouth 2 (two) times daily.  1  . ONE TOUCH ULTRA TEST test strip USE TO TEST BLOOD SUGAR DAILY  3  . polyethylene glycol powder  (GLYCOLAX/MIRALAX) powder TAKE ALL AS ONCE FOR 1 DOSE AS DIRECTED  0  . simvastatin (ZOCOR) 40 MG tablet Take 40 mg by mouth at bedtime.  3  . SYNTHROID 137 MCG tablet TAKE 1 TABLET BY MOUTH 30-60 MINUTES BEFORE BREAKFAST WITH A FULL GLASS OF WATER  4   Current Facility-Administered Medications on File Prior to Visit  Medication Dose Route Frequency Provider Last Rate Last Dose  . triamcinolone acetonide (KENALOG) 10 MG/ML injection 10 mg  10 mg Other Once Asencion Islam, DPM        Allergies  Allergen Reactions  . Desipramine Other (See Comments)    Sweats  . Dexlansoprazole Other (See Comments)    Increase BG  . Rosuvastatin Other (See Comments)    Heartburn    Objective:  General: Alert and oriented x3 in no acute distress  Dermatology: Minimall hyperkeratotic lesion overlying 5 PIPJ dorsal lateral aspect on left and lateral aspect of 4th toe on left. There is also hyperkeratotic lesions at the medial 5th toe nail suggestive of lister corn that is tender to touch. No open lesions bilateral lower extremities, no webspace macerations, no ecchymosis bilateral, all nails x 10 are well manicured.  Vascular: Dorsalis Pedis and Posterior Tibial pedal pulses 2/4, Capillary Fill Time 3 seconds,(+) pedal hair growth bilateral, no edema bilateral lower extremities, Temperature gradient within normal limits.  Neurology: Gross sensation intact via light touch bilateral, Protective  sensation intact  with Phoebe PerchSemmes Weinstein Monofilament to all pedal sites, Position sense intact, vibratory intact bilateral, Deep tendon reflexes within normal limits bilateral, No babinski sign present bilateral.   Musculoskeletal: Semi-flexible varus hammertoe 4-5 on left with Mild tenderness with palpation at medial 5th left 5th toe at keratosis, Left 1st MTPJ with mild pain and limitation on range of motion 30 DF and 10 PF, all other joint range of motion is within normal limits, No pain with calf compression  bilateral.  Strength within normal limits in all groups bilateral.        Assessment and Plan: Problem List Items Addressed This Visit    None    Visit Diagnoses    Hallux limitus of left foot    -  Primary    Relevant Medications    triamcinolone acetonide (KENALOG) 10 MG/ML injection 10 mg    Capsulitis        Relevant Medications    triamcinolone acetonide (KENALOG) 10 MG/ML injection 10 mg    Corns and callosities        Pain of toe of left foot        Hammertoe, unspecified laterality        Diabetes mellitus without complication (HCC)           -Complete examination performed -Encouraged daily foot inspection in the setting of Diabetes  -Discussed treatement options for interdigital corn with hammertoe deformity  -Parred keratotic corn at medial nail fold at left 5th toe using sterile chisel blade. Gave silicone spacer/toe cap and instructed patient on use. Advised close monitoring if recurs to consider hammertoe correction -After oral consent and aseptic prep, injected a mixture containing 1 ml of 2%  plain lidocaine, 1 ml 0.5% plain marcaine, 0.5 ml of kenalog 10 and 0.5 ml of dexamethasone phosphate into left 1st MTPJ without complication. Post-injection care discussed with patient.  -Advised closed monitoring of Left big toe joint and to continue with good supportive shoes that have a firm/stiff sole.  Encouraged patient to consider orthotics vs surgery  -Patient to return to office as needed or sooner if condition worsens.  Asencion Islamitorya Baylynn Shifflett, DPM

## 2015-07-04 ENCOUNTER — Emergency Department
Admission: EM | Admit: 2015-07-04 | Discharge: 2015-07-04 | Disposition: A | Discharge status: Home / Self Care | Payer: 59 | Attending: Emergency Medicine | Admitting: Emergency Medicine

## 2015-07-04 ENCOUNTER — Encounter: Payer: Self-pay | Admitting: Emergency Medicine

## 2015-07-04 ENCOUNTER — Emergency Department: Payer: 59

## 2015-07-04 DIAGNOSIS — E119 Type 2 diabetes mellitus without complications: Secondary | ICD-10-CM | POA: Diagnosis not present

## 2015-07-04 DIAGNOSIS — Z7984 Long term (current) use of oral hypoglycemic drugs: Secondary | ICD-10-CM | POA: Insufficient documentation

## 2015-07-04 DIAGNOSIS — R0789 Other chest pain: Secondary | ICD-10-CM | POA: Insufficient documentation

## 2015-07-04 DIAGNOSIS — M169 Osteoarthritis of hip, unspecified: Secondary | ICD-10-CM | POA: Diagnosis not present

## 2015-07-04 DIAGNOSIS — M5136 Other intervertebral disc degeneration, lumbar region: Secondary | ICD-10-CM | POA: Diagnosis not present

## 2015-07-04 DIAGNOSIS — E785 Hyperlipidemia, unspecified: Secondary | ICD-10-CM | POA: Diagnosis not present

## 2015-07-04 DIAGNOSIS — Z79899 Other long term (current) drug therapy: Secondary | ICD-10-CM | POA: Diagnosis not present

## 2015-07-04 DIAGNOSIS — E039 Hypothyroidism, unspecified: Secondary | ICD-10-CM | POA: Insufficient documentation

## 2015-07-04 HISTORY — DX: Type 2 diabetes mellitus without complications: E11.9

## 2015-07-04 HISTORY — DX: Disorder of thyroid, unspecified: E07.9

## 2015-07-04 LAB — CBC
HCT: 40.1 % (ref 35.0–47.0)
HEMOGLOBIN: 13.5 g/dL (ref 12.0–16.0)
MCH: 28.5 pg (ref 26.0–34.0)
MCHC: 33.7 g/dL (ref 32.0–36.0)
MCV: 84.4 fL (ref 80.0–100.0)
Platelets: 298 10*3/uL (ref 150–440)
RBC: 4.75 MIL/uL (ref 3.80–5.20)
RDW: 14.5 % (ref 11.5–14.5)
WBC: 11.4 10*3/uL — ABNORMAL HIGH (ref 3.6–11.0)

## 2015-07-04 LAB — BASIC METABOLIC PANEL
ANION GAP: 8 (ref 5–15)
BUN: 19 mg/dL (ref 6–20)
CALCIUM: 9.7 mg/dL (ref 8.9–10.3)
CHLORIDE: 103 mmol/L (ref 101–111)
CO2: 25 mmol/L (ref 22–32)
Creatinine, Ser: 0.99 mg/dL (ref 0.44–1.00)
GFR calc non Af Amer: >60 mL/min (ref >60)
GLUCOSE: 173 mg/dL — AB (ref 65–99)
Potassium: 4.3 mmol/L (ref 3.5–5.1)
Sodium: 136 mmol/L (ref 135–145)

## 2015-07-04 LAB — TROPONIN I: Troponin I: <0.03 ng/mL (ref <0.031)

## 2015-07-04 LAB — FIBRIN DERIVATIVES D-DIMER (ARMC ONLY): Fibrin derivatives D-dimer (ARMC): 184 (ref 0–499)

## 2015-07-04 MED ORDER — HYDROCODONE-ACETAMINOPHEN 5-325 MG PO TABS
1.0000 | ORAL_TABLET | Freq: Four times a day (QID) | ORAL | Status: AC | PRN
  Administered 2015-07-04: 1 via ORAL

## 2015-07-04 MED ORDER — ASPIRIN EC 325 MG PO TBEC
325.0000 mg | DELAYED_RELEASE_TABLET | Freq: Once | ORAL | Status: AC
  Administered 2015-07-04: 325 mg via ORAL

## 2015-07-04 MED ORDER — ASPIRIN 81 MG PO CHEW: 324.0000 mg | CHEWABLE_TABLET | Freq: Once | ORAL | Status: DC

## 2015-07-04 NOTE — ED Notes (Addendum)
Pt ambulatory to triage with steady gait. Pt c/o left sided chest pain under breast radiating to left side of neck since this morning at 1:30. Pt denies nausea or vomiting.  Reports increased chest pain with deep breathing and movement. Pt alert and oriented, respirations even and unlabored.

## 2015-07-04 NOTE — Discharge Instructions (Signed)
You have been seen in the Emergency Department (ED) today for chest pain.  As we have discussed today’s test results are normal, but you may require further testing. ° °Please follow up with the recommended doctor as instructed above in these documents regarding today’s emergent visit and your recent symptoms to discuss further management.  Continue to take your regular medications. If you are not doing so already, please also take a daily baby aspirin (81 mg), at least until you follow up with your doctor. ° °Return to the Emergency Department (ED) if you experience any further chest pain/pressure/tightness, difficulty breathing, or sudden sweating, or other symptoms that concern you. ° ° °Chest Pain Observation °It is often hard to give a specific diagnosis for the cause of chest pain. Among other possibilities your symptoms might be caused by inadequate oxygen delivery to your heart (angina). Angina that is not treated or evaluated can lead to a heart attack (myocardial infarction) or death. °Blood tests, electrocardiograms, and X-rays may have been done to help determine a possible cause of your chest pain. After evaluation and observation, your health care provider has determined that it is unlikely your pain was caused by an unstable condition that requires hospitalization. However, a full evaluation of your pain may need to be completed, with additional diagnostic testing as directed. It is very important to keep your follow-up appointments. Not keeping your follow-up appointments could result in permanent heart damage, disability, or death. If there is any problem keeping your follow-up appointments, you must call your health care provider. °HOME CARE INSTRUCTIONS  °Due to the slight chance that your pain could be angina, it is important to follow your health care provider's treatment plan and also maintain a healthy lifestyle: °· Maintain or work toward achieving a healthy weight. °· Stay physically active  and exercise regularly. °· Decrease your salt intake. °· Eat a balanced, healthy diet. Talk to a dietitian to learn about heart-healthy foods. °· Increase your fiber intake by including whole grains, vegetables, fruits, and nuts in your diet. °· Avoid situations that cause stress, anger, or depression. °· Take medicines as advised by your health care provider. Report any side effects to your health care provider. Do not stop medicines or adjust the dosages on your own. °· Quit smoking. Do not use nicotine patches or gum until you check with your health care provider. °· Keep your blood pressure, blood sugar, and cholesterol levels within normal limits. °· Limit alcohol intake to no more than 1 drink per day for women who are not pregnant and 2 drinks per day for men. °· Do not abuse drugs. °SEEK IMMEDIATE MEDICAL CARE IF: °You have severe chest pain or pressure which may include symptoms such as: °· You feel pain or pressure in your arms, neck, jaw, or back. °· You have severe back or abdominal pain, feel sick to your stomach (nauseous), or throw up (vomit). °· You are sweating profusely. °· You are having a fast or irregular heartbeat. °· You feel short of breath while at rest. °· You notice increasing shortness of breath during rest, sleep, or with activity. °· You have chest pain that does not get better after rest or after taking your usual medicine. °· You wake from sleep with chest pain. °· You are unable to sleep because you cannot breathe. °· You develop a frequent cough or you are coughing up blood. °· You feel dizzy, faint, or experience extreme fatigue. °· You develop severe weakness, dizziness, fainting,   or chills. °Any of these symptoms may represent a serious problem that is an emergency. Do not wait to see if the symptoms will go away. Call your local emergency services (911 in the [place] Do not drive yourself to the hospital. °MAKE SURE YOU: °· Understand these instructions. °· Will watch your  condition. °· Will get help right away if you are not doing well or get worse. °  °This information is not intended to replace advice given to you by your health care provider. Make sure you discuss any questions you have with your health care provider. °  °Document Released: 02/22/2010 Document Revised: 01/25/2013 Document Reviewed: 07/22/2012 °[employer] Interactive Patient Education ©2016 [employer] ° °

## 2015-07-04 NOTE — ED Provider Notes (Signed)
[facility] Emergency Department Provider Note  ____________________________________________  Time seen: Approximately 8:46 AM  I have reviewed the triage vital signs and the nursing notes.   HISTORY  Chief Complaint Chest Pain    HPI Suzanne Munoz is a 50 y.o. female presents for evaluation of left sided chest pain.  The patient reports that she woke up and got out of bed and about 1 in the morning to use the bathroom and began experiencing a sharp left-sided pain when moving her arm, rolling in bed, or taking a deep breath just around the left armpit and breast area. The pain does radiate somewhat up in towards her left upper chest especially with movement and lifting arms above the head.  She denies any heavy chest pressure, radiation to the neck arms or back. No fevers chills nausea vomiting or shortness of breath.  She has not taken any medication to assist in alleviating pain at this time.  She does report a family history of heart disease with her dad had a heart attack while he was in his late 20s.   Past Medical History  Diagnosis Date  . Thyroid disease   . Diabetes mellitus without complication [facility])     Patient Active Problem List   Diagnosis Date Noted  . Controlled type 2 diabetes mellitus without complication (HCC) 12/19/2014  . Arthralgia of hip 10/19/2014  . Left leg pain 10/19/2014  . Adnexal pain 10/19/2014  . Degenerative arthritis of hip 12/20/2013  . B12 deficiency 10/19/2013  . Iron deficiency 10/19/2013  . Adiposity 10/19/2013  . Degeneration of intervertebral disc of lumbar region 07/21/2013  . Neuritis or radiculitis due to rupture of lumbar intervertebral disc 07/21/2013  . Trochanteric bursitis of left hip 07/21/2013  . Diabetes mellitus (HCC) 05/12/2013  . Brash 05/12/2013  . HLD (hyperlipidemia) 05/12/2013  . Adult hypothyroidism 05/12/2013  . Adaptive colitis 05/12/2013  . Reflux 03/20/2011    Past  Surgical History  Procedure Laterality Date  . Foot surgery    . Back surgery    . Tonsillectomy    . Orthoscopic knee surgery      Current Outpatient Rx  Name  Route  Sig  Dispense  Refill  . Cyanocobalamin (B-12 PO)   Oral   Take by mouth.         . Fexofenadine HCl (ALLEGRA PO)   Oral   Take by mouth.         Marland Kitchen glimepiride (AMARYL) 4 MG tablet   Oral   Take 5 mg by mouth daily with breakfast. Take with a  tab to make          . hyoscyamine (LEVSIN, ANASPAZ) 0.125 MG tablet      TAKE 1 TABLET (0.125 MG TOTAL) BY MOUTH EVERY 4 (FOUR) HOURS AS NEEDED FOR CRAMPING.      3   . lisinopril (PRINIVIL,ZESTRIL) 5 MG tablet   Oral   Take 5 mg by mouth daily.      1   . metFORMIN (GLUCOPHAGE-XR) 500 MG 24 hr tablet   Oral   Take 1,000 mg by mouth 2 (two) times daily.      1   . Multiple Vitamin (MULTIVITAMIN WITH MINERALS) TABS tablet   Oral   Take 1 tablet by mouth daily.         . ranitidine (ZANTAC) 150 MG tablet   Oral   Take 150 mg by mouth 2 (two) times daily.         Marland Kitchen  simvastatin (ZOCOR) 40 MG tablet   Oral   Take 40 mg by mouth at bedtime.      3   . SYNTHROID 137 MCG tablet      TAKE 1 TABLET BY MOUTH 30-60 MINUTES BEFORE BREAKFAST WITH A FULL GLASS OF WATER      4     Dispense as written.     Allergies Desipramine; Dexlansoprazole; and Rosuvastatin  No family history on file.  Social History Social History  Substance Use Topics  . Smoking status: Never Smoker   . Smokeless tobacco: Never Used  . Alcohol Use: 0.0 oz/week    0 Standard drinks or equivalent per week    Review of Systems Constitutional: No fever/chills Eyes: No visual changes. ENT: No sore throat. Cardiovascular: See history of present illness  Respiratory: Denies shortness of breath. Gastrointestinal: No abdominal pain.  No nausea, no vomiting.  No diarrhea.  No constipation. Genitourinary: Negative for dysuria. Musculoskeletal: Negative for back  pain. Skin: Negative for rash. Neurological: Negative for headaches, focal weakness or numbness.  Denies pregnancy, prior history of an intrauterine device and has a [employer] fast streamer.  Patient does report that she drove back from [place] about 2 weeks ago but has not had any leg pain or swelling in her legs. She has no history of recent surgeries, previous blood clots, coughing up blood, taking an estrogen, or other concerns.  10-point ROS otherwise negative.  ____________________________________________   PHYSICAL EXAM:  VITAL SIGNS: ED Triage Vitals  Enc Vitals Group     BP 07/04/15 0614 131/74 mmHg     Pulse Rate 07/04/15 0614 82     Resp 07/04/15 0614 18     Temp 07/04/15 0614 97.4 F (36.3 C)     Temp Source 07/04/15 0614 Oral     SpO2 07/04/15 0614 98 %     Weight 07/04/15 0614 219 lb (99.338 kg)     Height 07/04/15 0614 5\' 6"  (1.676 m)     Head Cir --      Peak Flow --      Pain Score 07/04/15 0615 7     Pain Loc --      Pain Edu? --      Excl. in GC? --    Constitutional: Alert and oriented. Well appearing and in no acute distress. Eyes: Conjunctivae are normal. PERRL. EOMI. Head: Atraumatic. Nose: No congestion/rhinnorhea. Mouth/Throat: Mucous membranes are moist.  Oropharynx non-erythematous. Neck: No stridor.   Cardiovascular: Normal rate, regular rhythm. Grossly normal heart sounds.  Good peripheral circulation.Patient notes chest pain with taking a deep breath. She also notes the pain when she lifts her left arm over her head. Respiratory: Normal respiratory effort.  No retractions. Lungs CTAB. Gastrointestinal: Soft and nontender. No distention. No abdominal bruits. No CVA tenderness. Musculoskeletal: No lower extremity tenderness nor edema.  No joint effusions. Neurologic:  Normal speech and language. No gross focal neurologic deficits are appreciated.Skin:  Skin is warm, dry and intact. No rash noted. Psychiatric: Mood and affect are normal. Speech and behavior  are normal.  ____________________________________________   LABS (all labs ordered are listed, but only abnormal results are displayed)  Labs Reviewed  BASIC METABOLIC PANEL - Abnormal; Notable for the following:    Glucose, Bld 173 (*)    All other components within normal limits  CBC - Abnormal; Notable for the following:    WBC 11.4 (*)    All other components within normal limits  TROPONIN I  FIBRIN DERIVATIVES D-DIMER [facility] ONLY)  TROPONIN I   ____________________________________________  EKG  ED ECG REPORT I, QUALE, MARK, the attending physician, personally viewed and interpreted this ECG.  Date: 07/04/2015 EKG Time: 620 Rate: 75 Rhythm: normal sinus rhythm QRS Axis: normal Intervals: normal ST/T Wave abnormalities: normal Conduction Disturbances: none Narrative Interpretation: unremarkable  ____________________________________________  RADIOLOGY    DG Chest 2 View (Final result) Result time: 07/04/15 07:26:43   Final result by Rad Results In Interface (07/04/15 07:26:43)   Narrative:   CLINICAL DATA: Left-sided chest pain radiating to the left aspect of the neck since early this morning ; symptoms worse with deep breathing and movement  EXAM: CHEST 2 VIEW  COMPARISON: None in PACs  FINDINGS: The lungs are adequately inflated and clear. The heart and pulmonary vascularity are normal. The mediastinum is normal in width. There is no pleural effusion or pneumothorax. There is gentle dextrocurvature centered in the mid thoracic spine.  IMPRESSION: There is no active cardiopulmonary disease.   Electronically Signed By: David SwazilandJordan M.D. On: 07/04/2015 07:26    ____________________________________________   PROCEDURES  Procedure(s) performed: None  Critical Care performed: No  ____________________________________________   INITIAL IMPRESSION / ASSESSMENT AND PLAN / ED COURSE  Pertinent labs & imaging results that were  available during my care of the patient were reviewed by me and considered in my medical decision making (see chart for details).  Patient here for evaluation of left-sided chest pain. Very atypical of acute coronary syndrome but more rather pleuritic or potentially musculoskeletal in nature by my assessment. Her EKG is normal. She does have cardiac risk factors including a family history and diabetes, however she is a nonsmoker and she does report she had a stress test several years ago and was told it was normal. Her symptoms very atypical, first troponin negative, and her heart score is low risk.  Her symptoms are very minimal, and she only requesting aspirin at this time but reports her pain and discomfort comes about especially with movement or lifting her arms above her head. She has musculoskeletal neurovascular intact including the upper extremities bilateral.  There is no evidence of pneumothorax, pulmonary emboli, acute coronary syndrome, dissection, or other acute chest etiology at this time. Most suspect likely musculoskeletal in nature, but given her risk factors second troponin performed and is normal.   Patient will follow-up closely with cardiology and is deemed low risk for ongoing outpatient evaluation with good return precautions and follow-up discussed. Patient and husband both very agreeable with plan. ____________________________________________   FINAL CLINICAL IMPRESSION(S) / ED DIAGNOSES  Final diagnoses:  Atypical chest pain  Chest pain, musculoskeletal      Sharyn CreamerMark Quale, MD 07/04/15 1124

## 2015-07-20 ENCOUNTER — Other Ambulatory Visit: Payer: Self-pay | Admitting: Obstetrics and Gynecology

## 2015-07-20 DIAGNOSIS — Z1231 Encounter for screening mammogram for malignant neoplasm of breast: Secondary | ICD-10-CM

## 2015-08-09 ENCOUNTER — Other Ambulatory Visit: Payer: Self-pay | Admitting: Obstetrics and Gynecology

## 2015-08-09 ENCOUNTER — Ambulatory Visit
Admission: RE | Admit: 2015-08-09 | Discharge: 2015-08-09 | Disposition: A | Discharge status: Home / Self Care | Payer: 59 | Source: Ambulatory Visit | Attending: Obstetrics and Gynecology | Admitting: Obstetrics and Gynecology

## 2015-08-09 DIAGNOSIS — Z1231 Encounter for screening mammogram for malignant neoplasm of breast: Secondary | ICD-10-CM

## 2015-12-06 ENCOUNTER — Encounter
Admission: RE | Admit: 2015-12-06 | Discharge: 2015-12-06 | Disposition: A | Discharge status: Home / Self Care | Payer: 59 | Source: Ambulatory Visit | Attending: Orthopedic Surgery | Admitting: Orthopedic Surgery

## 2015-12-06 DIAGNOSIS — M1612 Unilateral primary osteoarthritis, left hip: Secondary | ICD-10-CM | POA: Insufficient documentation

## 2015-12-06 DIAGNOSIS — Z01812 Encounter for preprocedural laboratory examination: Secondary | ICD-10-CM | POA: Insufficient documentation

## 2015-12-06 HISTORY — DX: Anemia, unspecified: D64.9

## 2015-12-06 HISTORY — DX: Gastro-esophageal reflux disease without esophagitis: K21.9

## 2015-12-06 HISTORY — DX: Nausea with vomiting, unspecified: R11.2

## 2015-12-06 HISTORY — DX: Hypothyroidism, unspecified: E03.9

## 2015-12-06 HISTORY — DX: Other specified postprocedural states: Z98.890

## 2015-12-06 LAB — SURGICAL PCR SCREEN
MRSA, PCR: NEGATIVE
Staphylococcus aureus: NEGATIVE

## 2015-12-06 LAB — BASIC METABOLIC PANEL
Anion gap: 8 (ref 5–15)
BUN: 16 mg/dL (ref 6–20)
CALCIUM: 9.2 mg/dL (ref 8.9–10.3)
CO2: 29 mmol/L (ref 22–32)
CREATININE: 0.85 mg/dL (ref 0.44–1.00)
Chloride: 102 mmol/L (ref 101–111)
GFR calc Af Amer: >60 mL/min (ref >60)
GFR calc non Af Amer: >60 mL/min (ref >60)
GLUCOSE: 149 mg/dL — AB (ref 65–99)
Potassium: 3.9 mmol/L (ref 3.5–5.1)
Sodium: 139 mmol/L (ref 135–145)

## 2015-12-06 LAB — SEDIMENTATION RATE: SED RATE: 12 mm/h (ref 0–20)

## 2015-12-06 LAB — CBC
HEMATOCRIT: 37.5 % (ref 35.0–47.0)
Hemoglobin: 12.9 g/dL (ref 12.0–16.0)
MCH: 29.3 pg (ref 26.0–34.0)
MCHC: 34.4 g/dL (ref 32.0–36.0)
MCV: 85.3 fL (ref 80.0–100.0)
Platelets: 230 10*3/uL (ref 150–440)
RBC: 4.4 MIL/uL (ref 3.80–5.20)
RDW: 14.1 % (ref 11.5–14.5)
WBC: 6.8 10*3/uL (ref 3.6–11.0)

## 2015-12-06 LAB — TYPE AND SCREEN
ABO/RH(D): O NEG
ANTIBODY SCREEN: NEGATIVE

## 2015-12-06 LAB — PROTIME-INR
INR: 0.84
Prothrombin Time: 11.5 seconds (ref 11.4–15.2)

## 2015-12-06 LAB — APTT: aPTT: 31 seconds (ref 24–36)

## 2015-12-06 NOTE — Patient Instructions (Signed)
Your procedure is scheduled WU:JWJXBJYNon:THURSDAY 12/13/15 Report to Day Surgery. 2ND FLOOR MEDICAL MALL ENTRANCE To find out your arrival time please call [phone] between 1PM - 3PM on Wednesday 12/12/15.  Remember: Instructions that are not followed completely may result in serious medical risk, up to and including death, or upon the discretion of your surgeon and anesthesiologist your surgery may need to be rescheduled.    __X__ 1. Do not eat food or drink liquids after midnight. No gum chewing or hard candies.     __X__ 2. No Alcohol/NO SMOKING for 24 hours before or after surgery.   ____ 3. Bring all medications with you on the day of surgery if instructed.    __X__ 4. Notify your doctor if there is any change in your medical condition     (cold, fever, infections).     Do not wear jewelry, make-up, hairpins, clips or nail polish.  Do not wear lotions, powders, or perfumes.   Do not shave 48 hours prior to surgery. Men may shave face and neck.  Do not bring valuables to the hospital.    [facility] any belongings or valuables.               Contacts, dentures or bridgework may not be worn into surgery.  Leave your suitcase in the car. After surgery it may be brought to your room.  For patients admitted to the hospital, discharge time is determined by your                treatment team.   Patients discharged the day of surgery will not be allowed to drive home.   Please read over the following fact sheets that you were given:   Pain Booklet and MRSA Information   __X__ Take these medicines the morning of surgery with A SIP OF WATER:    1. LISINOPRIL  2. RANITIDINE  3. SYNTHROID  4.  5.  6.  ____ Fleet Enema (as directed)   __X__ Use CHG Soap as directed  __X__ Use inhalers on the day of surgery  __X__ Stop metformin 2 days prior to surgery    ____ Take 1/2 of usual insulin dose the night before surgery and none on the morning of surgery.   ____  Stop Coumadin/Plavix/aspirin on   ____ Stop Anti-inflammatories on    __X__ Stop supplements until after surgery.    ____ Bring C-Pap to the hospital.

## 2015-12-06 NOTE — Pre-Procedure Instructions (Signed)
ED interpretation of ECG  EKG  ED ECG REPORT I, QUALE, MARK, the attending physician, personally viewed and interpreted this ECG.  Date: 07/04/2015 EKG Time: 620 Rate: 75 Rhythm: normal sinus rhythm QRS Axis: normal Intervals: normal ST/T Wave abnormalities: normal Conduction Disturbances: none Narrative Interpretation: unremarkable

## 2015-12-07 LAB — URINALYSIS COMPLETE WITH MICROSCOPIC (ARMC ONLY)
BILIRUBIN URINE: NEGATIVE
GLUCOSE, UA: NEGATIVE mg/dL
Ketones, ur: NEGATIVE mg/dL
Nitrite: NEGATIVE
PH: 5 (ref 5.0–8.0)
Protein, ur: NEGATIVE mg/dL
SPECIFIC GRAVITY, URINE: 1.013 (ref 1.005–1.030)

## 2015-12-08 LAB — URINE CULTURE: Culture: >=100000 — AB

## 2015-12-10 NOTE — Pre-Procedure Instructions (Signed)
Faxed results abnormal urine culture to Dr Rosita KeaMenz.

## 2015-12-13 ENCOUNTER — Inpatient Hospital Stay: Payer: Commercial Managed Care - HMO

## 2015-12-13 ENCOUNTER — Inpatient Hospital Stay: Payer: Commercial Managed Care - HMO | Admitting: Anesthesiology

## 2015-12-13 ENCOUNTER — Inpatient Hospital Stay
Admission: RE | Admit: 2015-12-13 | Discharge: 2015-12-16 | DRG: 470 | Disposition: A | Discharge status: Home Health | Payer: Commercial Managed Care - HMO | Source: Ambulatory Visit | Attending: Orthopedic Surgery | Admitting: Orthopedic Surgery

## 2015-12-13 ENCOUNTER — Encounter
Admission: RE | Disposition: A | Discharge status: Home Health | Payer: Self-pay | Source: Ambulatory Visit | Attending: Orthopedic Surgery

## 2015-12-13 ENCOUNTER — Encounter: Payer: Self-pay | Admitting: *Deleted

## 2015-12-13 DIAGNOSIS — E119 Type 2 diabetes mellitus without complications: Secondary | ICD-10-CM | POA: Diagnosis present

## 2015-12-13 DIAGNOSIS — D62 Acute posthemorrhagic anemia: Secondary | ICD-10-CM | POA: Diagnosis not present

## 2015-12-13 DIAGNOSIS — Z8249 Family history of ischemic heart disease and other diseases of the circulatory system: Secondary | ICD-10-CM

## 2015-12-13 DIAGNOSIS — Z7984 Long term (current) use of oral hypoglycemic drugs: Secondary | ICD-10-CM

## 2015-12-13 DIAGNOSIS — K219 Gastro-esophageal reflux disease without esophagitis: Secondary | ICD-10-CM | POA: Diagnosis present

## 2015-12-13 DIAGNOSIS — M1612 Unilateral primary osteoarthritis, left hip: Secondary | ICD-10-CM | POA: Diagnosis present

## 2015-12-13 DIAGNOSIS — Z801 Family history of malignant neoplasm of trachea, bronchus and lung: Secondary | ICD-10-CM | POA: Diagnosis not present

## 2015-12-13 DIAGNOSIS — G8918 Other acute postprocedural pain: Secondary | ICD-10-CM

## 2015-12-13 DIAGNOSIS — Z86010 Personal history of colonic polyps: Secondary | ICD-10-CM | POA: Diagnosis not present

## 2015-12-13 DIAGNOSIS — R262 Difficulty in walking, not elsewhere classified: Secondary | ICD-10-CM

## 2015-12-13 DIAGNOSIS — Z833 Family history of diabetes mellitus: Secondary | ICD-10-CM

## 2015-12-13 DIAGNOSIS — E039 Hypothyroidism, unspecified: Secondary | ICD-10-CM | POA: Diagnosis present

## 2015-12-13 DIAGNOSIS — M6281 Muscle weakness (generalized): Secondary | ICD-10-CM

## 2015-12-13 DIAGNOSIS — Z79899 Other long term (current) drug therapy: Secondary | ICD-10-CM | POA: Diagnosis not present

## 2015-12-13 DIAGNOSIS — Z419 Encounter for procedure for purposes other than remedying health state, unspecified: Secondary | ICD-10-CM

## 2015-12-13 HISTORY — PX: TOTAL HIP ARTHROPLASTY: SHX124

## 2015-12-13 LAB — CBC
HCT: 33.9 % — ABNORMAL LOW (ref 35.0–47.0)
Hemoglobin: 11.6 g/dL — ABNORMAL LOW (ref 12.0–16.0)
MCH: 29.2 pg (ref 26.0–34.0)
MCHC: 34.1 g/dL (ref 32.0–36.0)
MCV: 85.5 fL (ref 80.0–100.0)
Platelets: 214 K/uL (ref 150–440)
RBC: 3.97 MIL/uL (ref 3.80–5.20)
RDW: 13.8 % (ref 11.5–14.5)
WBC: 9.1 K/uL (ref 3.6–11.0)

## 2015-12-13 LAB — GLUCOSE, CAPILLARY
GLUCOSE-CAPILLARY: 156 mg/dL — AB (ref 65–99)
Glucose-Capillary: 165 mg/dL — ABNORMAL HIGH (ref 65–99)
Glucose-Capillary: 250 mg/dL — ABNORMAL HIGH (ref 65–99)

## 2015-12-13 LAB — CREATININE, SERUM: Creatinine, Ser: 0.75 mg/dL (ref 0.44–1.00)

## 2015-12-13 LAB — POCT PREGNANCY, URINE: Preg Test, Ur: NEGATIVE

## 2015-12-13 LAB — ABO/RH: ABO/RH(D): O NEG

## 2015-12-13 SURGERY — ARTHROPLASTY, HIP, TOTAL, ANTERIOR APPROACH
Anesthesia: Spinal | Site: Hip | Laterality: Left | Wound class: Clean

## 2015-12-13 MED ORDER — LACTATED RINGERS IV SOLN
INTRAVENOUS | Status: DC | PRN
  Administered 2015-12-13 (×2): via INTRAVENOUS

## 2015-12-13 MED ORDER — MAGNESIUM HYDROXIDE 400 MG/5ML PO SUSP
30.0000 mL | Freq: Every day | ORAL | Status: DC | PRN
  Administered 2015-12-15: 30 mL via ORAL
  Filled 2015-12-13: qty 30

## 2015-12-13 MED ORDER — ONDANSETRON HCL 4 MG/2ML IJ SOLN
4.0000 mg | Freq: Four times a day (QID) | INTRAMUSCULAR | Status: DC | PRN
  Administered 2015-12-13 – 2015-12-14 (×2): 4 mg via INTRAVENOUS
  Filled 2015-12-13 (×2): qty 2

## 2015-12-13 MED ORDER — INSULIN ASPART 100 UNIT/ML ~~LOC~~ SOLN
0.0000 [IU] | Freq: Three times a day (TID) | SUBCUTANEOUS | Status: DC
  Administered 2015-12-14 (×2): 3 [IU] via SUBCUTANEOUS
  Administered 2015-12-14: 5 [IU] via SUBCUTANEOUS
  Administered 2015-12-15: 3 [IU] via SUBCUTANEOUS
  Administered 2015-12-15: 5 [IU] via SUBCUTANEOUS
  Administered 2015-12-15: 2 [IU] via SUBCUTANEOUS
  Administered 2015-12-16: 3 [IU] via SUBCUTANEOUS
  Filled 2015-12-13: qty 2
  Filled 2015-12-13: qty 3
  Filled 2015-12-13: qty 5
  Filled 2015-12-13: qty 3
  Filled 2015-12-13: qty 5
  Filled 2015-12-13: qty 3
  Filled 2015-12-13: qty 2

## 2015-12-13 MED ORDER — CEFAZOLIN SODIUM-DEXTROSE 2-4 GM/100ML-% IV SOLN
INTRAVENOUS | Status: AC
  Filled 2015-12-13: qty 100

## 2015-12-13 MED ORDER — FENTANYL CITRATE (PF) 100 MCG/2ML IJ SOLN
INTRAMUSCULAR | Status: AC
  Administered 2015-12-13: 25 ug via INTRAVENOUS
  Filled 2015-12-13: qty 2

## 2015-12-13 MED ORDER — ACETAMINOPHEN 325 MG PO TABS
650.0000 mg | ORAL_TABLET | Freq: Four times a day (QID) | ORAL | Status: DC | PRN
Start: 2015-12-13 — End: 2015-12-16

## 2015-12-13 MED ORDER — CYANOCOBALAMIN 500 MCG PO TABS
500.0000 ug | ORAL_TABLET | Freq: Every day | ORAL | Status: DC
  Administered 2015-12-15 – 2015-12-16 (×2): 500 ug via ORAL
  Filled 2015-12-13 (×2): qty 1

## 2015-12-13 MED ORDER — MENTHOL 3 MG MT LOZG
1.0000 | LOZENGE | OROMUCOSAL | Status: DC | PRN
  Filled 2015-12-13: qty 9

## 2015-12-13 MED ORDER — OXYCODONE HCL 5 MG PO TABS
5.0000 mg | ORAL_TABLET | ORAL | Status: DC | PRN
  Administered 2015-12-13: 10 mg via ORAL
  Administered 2015-12-13: 5 mg via ORAL
  Administered 2015-12-14 – 2015-12-15 (×7): 10 mg via ORAL
  Administered 2015-12-15: 5 mg via ORAL
  Administered 2015-12-15 – 2015-12-16 (×3): 10 mg via ORAL
  Filled 2015-12-13: qty 1
  Filled 2015-12-13 (×2): qty 2
  Filled 2015-12-13: qty 1
  Filled 2015-12-13 (×10): qty 2

## 2015-12-13 MED ORDER — SODIUM CHLORIDE 0.9 % IV SOLN
INTRAVENOUS | Status: DC | PRN
  Administered 2015-12-13: 1000 mg via INTRAVENOUS

## 2015-12-13 MED ORDER — ENOXAPARIN SODIUM 40 MG/0.4ML ~~LOC~~ SOLN
40.0000 mg | SUBCUTANEOUS | Status: DC
  Administered 2015-12-15 – 2015-12-16 (×2): 40 mg via SUBCUTANEOUS
  Filled 2015-12-13 (×3): qty 0.4

## 2015-12-13 MED ORDER — ONDANSETRON HCL 4 MG PO TABS
4.0000 mg | ORAL_TABLET | Freq: Four times a day (QID) | ORAL | Status: DC | PRN
  Administered 2015-12-16: 4 mg via ORAL
  Filled 2015-12-13: qty 1

## 2015-12-13 MED ORDER — CEFAZOLIN SODIUM-DEXTROSE 2-4 GM/100ML-% IV SOLN
2.0000 g | Freq: Once | INTRAVENOUS | Status: AC
  Administered 2015-12-13: 2 g via INTRAVENOUS

## 2015-12-13 MED ORDER — DOCUSATE SODIUM 100 MG PO CAPS
100.0000 mg | ORAL_CAPSULE | Freq: Two times a day (BID) | ORAL | Status: DC
  Administered 2015-12-13 – 2015-12-16 (×5): 100 mg via ORAL
  Filled 2015-12-13 (×5): qty 1

## 2015-12-13 MED ORDER — ZOLPIDEM TARTRATE 5 MG PO TABS: 5.0000 mg | ORAL_TABLET | Freq: Every evening | ORAL | Status: DC | PRN

## 2015-12-13 MED ORDER — BUPIVACAINE-EPINEPHRINE 0.25% -1:200000 IJ SOLN
INTRAMUSCULAR | Status: DC | PRN
  Administered 2015-12-13: 30 mL

## 2015-12-13 MED ORDER — MORPHINE SULFATE (PF) 2 MG/ML IV SOLN
2.0000 mg | INTRAVENOUS | Status: DC | PRN
  Administered 2015-12-13 – 2015-12-14 (×5): 2 mg via INTRAVENOUS
  Filled 2015-12-13 (×5): qty 1

## 2015-12-13 MED ORDER — TRANEXAMIC ACID 1000 MG/10ML IV SOLN
1000.0000 mg | INTRAVENOUS | Status: DC
  Filled 2015-12-13: qty 10

## 2015-12-13 MED ORDER — ADULT MULTIVITAMIN W/MINERALS CH
1.0000 | ORAL_TABLET | Freq: Every day | ORAL | Status: DC
  Administered 2015-12-15 – 2015-12-16 (×2): 1 via ORAL
  Filled 2015-12-13 (×2): qty 1

## 2015-12-13 MED ORDER — PROPOFOL 500 MG/50ML IV EMUL
INTRAVENOUS | Status: DC | PRN
  Administered 2015-12-13: 75 ug/kg/min via INTRAVENOUS

## 2015-12-13 MED ORDER — METFORMIN HCL ER 500 MG PO TB24
1000.0000 mg | ORAL_TABLET | Freq: Two times a day (BID) | ORAL | Status: DC
  Administered 2015-12-13 – 2015-12-16 (×5): 1000 mg via ORAL
  Filled 2015-12-13 (×5): qty 2

## 2015-12-13 MED ORDER — METOCLOPRAMIDE HCL 10 MG PO TABS
5.0000 mg | ORAL_TABLET | Freq: Three times a day (TID) | ORAL | Status: DC | PRN
Start: 2015-12-13 — End: 2015-12-16

## 2015-12-13 MED ORDER — GLIMEPIRIDE 2 MG PO TABS
1.0000 mg | ORAL_TABLET | Freq: Every day | ORAL | Status: DC
  Administered 2015-12-15 – 2015-12-16 (×2): 1 mg via ORAL
  Filled 2015-12-13: qty 2
  Filled 2015-12-13 (×2): qty 1

## 2015-12-13 MED ORDER — LISINOPRIL 5 MG PO TABS
5.0000 mg | ORAL_TABLET | Freq: Every day | ORAL | Status: DC
  Administered 2015-12-15 – 2015-12-16 (×2): 5 mg via ORAL
  Filled 2015-12-13 (×2): qty 1

## 2015-12-13 MED ORDER — DIPHENHYDRAMINE HCL 12.5 MG/5ML PO ELIX: 12.5000 mg | ORAL_SOLUTION | ORAL | Status: DC | PRN

## 2015-12-13 MED ORDER — FLUTICASONE PROPIONATE 50 MCG/ACT NA SUSP
1.0000 | Freq: Every day | NASAL | Status: DC | PRN
  Filled 2015-12-13: qty 16

## 2015-12-13 MED ORDER — ACETAMINOPHEN 650 MG RE SUPP: 650.0000 mg | Freq: Four times a day (QID) | RECTAL | Status: DC | PRN

## 2015-12-13 MED ORDER — SODIUM CHLORIDE 0.9 % IV SOLN
INTRAVENOUS | Status: DC | PRN
  Administered 2015-12-13: 30 ug/min via INTRAVENOUS

## 2015-12-13 MED ORDER — FENTANYL CITRATE (PF) 100 MCG/2ML IJ SOLN
25.0000 ug | INTRAMUSCULAR | Status: DC | PRN
  Administered 2015-12-13: 25 ug via INTRAVENOUS

## 2015-12-13 MED ORDER — SODIUM CHLORIDE 0.9 % IV SOLN
INTRAVENOUS | Status: DC
  Administered 2015-12-13 – 2015-12-14 (×3): via INTRAVENOUS

## 2015-12-13 MED ORDER — SIMVASTATIN 20 MG PO TABS
40.0000 mg | ORAL_TABLET | Freq: Every day | ORAL | Status: DC
  Administered 2015-12-13 – 2015-12-15 (×3): 40 mg via ORAL
  Filled 2015-12-13 (×3): qty 2

## 2015-12-13 MED ORDER — POVIDONE-IODINE 10 % EX SOLN
CUTANEOUS | Status: DC | PRN
  Administered 2015-12-13: 1 via TOPICAL

## 2015-12-13 MED ORDER — ONDANSETRON HCL 4 MG/2ML IJ SOLN: 4.0000 mg | Freq: Once | INTRAMUSCULAR | Status: DC | PRN

## 2015-12-13 MED ORDER — METHOCARBAMOL 500 MG PO TABS
500.0000 mg | ORAL_TABLET | Freq: Four times a day (QID) | ORAL | Status: DC | PRN
  Administered 2015-12-13 – 2015-12-15 (×4): 500 mg via ORAL
  Filled 2015-12-13 (×4): qty 1

## 2015-12-13 MED ORDER — MAGNESIUM CITRATE PO SOLN
1.0000 | Freq: Once | ORAL | Status: AC | PRN
  Administered 2015-12-16: 1 via ORAL
  Filled 2015-12-13 (×2): qty 296

## 2015-12-13 MED ORDER — BISACODYL 10 MG RE SUPP: 10.0000 mg | Freq: Every day | RECTAL | Status: DC | PRN

## 2015-12-13 MED ORDER — PHENOL 1.4 % MT LIQD
1.0000 | OROMUCOSAL | Status: DC | PRN
  Filled 2015-12-13: qty 177

## 2015-12-13 MED ORDER — METHOCARBAMOL 1000 MG/10ML IJ SOLN
500.0000 mg | Freq: Four times a day (QID) | INTRAMUSCULAR | Status: DC | PRN
  Filled 2015-12-13: qty 5

## 2015-12-13 MED ORDER — LORATADINE 10 MG PO TABS
10.0000 mg | ORAL_TABLET | Freq: Every day | ORAL | Status: DC
  Administered 2015-12-15 – 2015-12-16 (×2): 10 mg via ORAL
  Filled 2015-12-13 (×2): qty 1

## 2015-12-13 MED ORDER — FAMOTIDINE 20 MG PO TABS
10.0000 mg | ORAL_TABLET | Freq: Two times a day (BID) | ORAL | Status: DC
  Administered 2015-12-13 – 2015-12-16 (×5): 10 mg via ORAL
  Filled 2015-12-13 (×5): qty 1

## 2015-12-13 MED ORDER — ALUM & MAG HYDROXIDE-SIMETH 200-200-20 MG/5ML PO SUSP: 30.0000 mL | ORAL | Status: DC | PRN

## 2015-12-13 MED ORDER — ALBUTEROL SULFATE (2.5 MG/3ML) 0.083% IN NEBU: 2.5000 mg | INHALATION_SOLUTION | RESPIRATORY_TRACT | Status: DC | PRN

## 2015-12-13 MED ORDER — LEVOTHYROXINE SODIUM 50 MCG PO TABS
137.0000 ug | ORAL_TABLET | Freq: Every day | ORAL | Status: DC
  Administered 2015-12-15 – 2015-12-16 (×2): 137 ug via ORAL
  Filled 2015-12-13 (×3): qty 1

## 2015-12-13 MED ORDER — BUPIVACAINE-EPINEPHRINE (PF) 0.25% -1:200000 IJ SOLN
INTRAMUSCULAR | Status: AC
  Filled 2015-12-13: qty 30

## 2015-12-13 MED ORDER — METOCLOPRAMIDE HCL 5 MG/ML IJ SOLN
5.0000 mg | Freq: Three times a day (TID) | INTRAMUSCULAR | Status: DC | PRN
  Administered 2015-12-14: 10 mg via INTRAVENOUS
  Filled 2015-12-13: qty 2

## 2015-12-13 MED ORDER — CEFAZOLIN SODIUM-DEXTROSE 2-4 GM/100ML-% IV SOLN
2.0000 g | Freq: Four times a day (QID) | INTRAVENOUS | Status: AC
  Administered 2015-12-13 – 2015-12-14 (×3): 2 g via INTRAVENOUS
  Filled 2015-12-13 (×3): qty 100

## 2015-12-13 MED ORDER — NEOMYCIN-POLYMYXIN B GU 40-200000 IR SOLN
Status: DC | PRN
  Administered 2015-12-13: 4 mL

## 2015-12-13 MED ORDER — SODIUM CHLORIDE 0.9 % IV SOLN
INTRAVENOUS | Status: DC
  Administered 2015-12-13: 13:00:00 via INTRAVENOUS

## 2015-12-13 MED ORDER — NEOMYCIN-POLYMYXIN B GU 40-200000 IR SOLN
Status: AC
  Filled 2015-12-13: qty 4

## 2015-12-13 MED ORDER — BUPIVACAINE HCL (PF) 0.5 % IJ SOLN
INTRAMUSCULAR | Status: DC | PRN
  Administered 2015-12-13: 3 mL

## 2015-12-13 SURGICAL SUPPLY — 56 items
BLADE SAW SAG 18.5X105 (BLADE) ×2 IMPLANT
BNDG COHESIVE 6X5 TAN STRL LF (GAUZE/BANDAGES/DRESSINGS) ×4 IMPLANT
CANISTER SUCT 1200ML W/VALVE (MISCELLANEOUS) ×2 IMPLANT
CAPT HIP TOTAL 3 ×1 IMPLANT
CATH FOL LEG HOLDER (MISCELLANEOUS) ×2 IMPLANT
CATH TRAY METER 16FR LF (MISCELLANEOUS) ×2 IMPLANT
CHLORAPREP W/TINT 26ML (MISCELLANEOUS) ×2 IMPLANT
DRAPE C-ARM XRAY 36X54 (DRAPES) ×3 IMPLANT
DRAPE INCISE IOBAN 66X60 STRL (DRAPES) IMPLANT
DRAPE POUCH INSTRU U-SHP 10X18 (DRAPES) ×2 IMPLANT
DRAPE SHEET LG 3/4 BI-LAMINATE (DRAPES) ×6 IMPLANT
DRAPE STERI IOBAN 125X83 (DRAPES) ×2 IMPLANT
DRAPE TABLE BACK 80X90 (DRAPES) ×2 IMPLANT
DRSG OPSITE POSTOP 4X8 (GAUZE/BANDAGES/DRESSINGS) ×2 IMPLANT
ELECT BLADE 6.5 EXT (BLADE) ×2 IMPLANT
GAUZE SPONGE 4X4 12PLY STRL (GAUZE/BANDAGES/DRESSINGS) ×1 IMPLANT
GLOVE BIO SURGEON STRL SZ7.5 (GLOVE) ×2 IMPLANT
GLOVE BIOGEL PI IND STRL 9 (GLOVE) ×1 IMPLANT
GLOVE BIOGEL PI INDICATOR 9 (GLOVE) ×1
GLOVE SURG SYN 9.0  PF PI (GLOVE) ×2
GLOVE SURG SYN 9.0 PF PI (GLOVE) ×2 IMPLANT
GOWN SRG 2XL LVL 4 RGLN SLV (GOWNS) ×1 IMPLANT
GOWN STRL NON-REIN 2XL LVL4 (GOWNS) ×2
GOWN STRL REUS W/ TWL LRG LVL3 (GOWN DISPOSABLE) ×1 IMPLANT
GOWN STRL REUS W/TWL LRG LVL3 (GOWN DISPOSABLE) ×4
HANDPIECE INTERPULSE COAX TIP (DISPOSABLE) ×2
HEMOVAC 400CC 10FR (MISCELLANEOUS) ×2 IMPLANT
HOOD PEEL AWAY FLYTE STAYCOOL (MISCELLANEOUS) ×2 IMPLANT
IV NS IRRIG 3000ML ARTHROMATIC (IV SOLUTION) ×2 IMPLANT
KIT PREVENA INCISION MGT20CM45 (CANNISTER) ×1 IMPLANT
MAT BLUE FLOOR 46X72 FLO (MISCELLANEOUS) ×2 IMPLANT
NDL SAFETY 18GX1.5 (NEEDLE) ×2 IMPLANT
NDL SPNL 18GX3.5 QUINCKE PK (NEEDLE) ×1 IMPLANT
NEEDLE FILTER BLUNT 18X 1/2SAF (NEEDLE) ×1
NEEDLE FILTER BLUNT 18X1 1/2 (NEEDLE) ×1 IMPLANT
NEEDLE SPNL 18GX3.5 QUINCKE PK (NEEDLE) ×2 IMPLANT
NS IRRIG 1000ML POUR BTL (IV SOLUTION) ×2 IMPLANT
PACK HIP COMPR (MISCELLANEOUS) ×2 IMPLANT
SET HNDPC FAN SPRY TIP SCT (DISPOSABLE) IMPLANT
SOL PREP PVP 2OZ (MISCELLANEOUS) ×2
SOLUTION PREP PVP 2OZ (MISCELLANEOUS) ×1 IMPLANT
SPONGE DRAIN TRACH 4X4 STRL 2S (GAUZE/BANDAGES/DRESSINGS) ×2 IMPLANT
STAPLER SKIN PROX 35W (STAPLE) ×2 IMPLANT
STRAP SAFETY BODY (MISCELLANEOUS) ×2 IMPLANT
SUT DVC 2 QUILL PDO  T11 36X36 (SUTURE) ×1
SUT DVC 2 QUILL PDO T11 36X36 (SUTURE) ×1 IMPLANT
SUT DVC QUILL MONODERM 30X30 (SUTURE) ×2 IMPLANT
SUT SILK 0 (SUTURE) ×2
SUT SILK 0 30XBRD TIE 6 (SUTURE) ×1 IMPLANT
SUT VIC AB 1 CT1 36 (SUTURE) ×2 IMPLANT
SYR 20CC LL (SYRINGE) ×2 IMPLANT
SYR 30ML LL (SYRINGE) ×2 IMPLANT
SYR 50ML LL SCALE MARK (SYRINGE) ×2 IMPLANT
TAPE MICROFOAM 4IN (TAPE) ×2 IMPLANT
TOWEL OR 17X26 4PK STRL BLUE (TOWEL DISPOSABLE) ×1 IMPLANT
TUBE KAMVAC SUCTION (TUBING) ×2 IMPLANT

## 2015-12-13 NOTE — Anesthesia Procedure Notes (Signed)
Spinal

## 2015-12-13 NOTE — H&P (Signed)
Reviewed paper H+P, will be scanned into chart. No changes noted.  

## 2015-12-13 NOTE — Progress Notes (Signed)
Patient was admitted to room 158 from surgery via room bed. A&O x4. Prevena wound vac and hemovac in place. NSL in place. Husband at bedside. Bed alarm in place for safety.

## 2015-12-13 NOTE — Transfer of Care (Signed)
Immediate Anesthesia Transfer of Care Note  Patient: Suzanne Munoz  Procedure(s) Performed: Procedure(s): TOTAL HIP ARTHROPLASTY ANTERIOR APPROACH (Left)  Patient Location: PACU  Anesthesia Type:Spinal  Level of Consciousness: sedated  Airway & Oxygen Therapy: Patient Spontanous Breathing and Patient connected to face mask oxygen  Post-op Assessment: Report given to RN and Post -op Vital signs reviewed and stable  Post vital signs: Reviewed and stable  Last Vitals:  Vitals:   12/13/15 1615 12/13/15 1625  BP: 101/73 (!) 99/40  Pulse: 93 84  Resp: 17 15  Temp:      Last Pain:  Vitals:   12/13/15 1216  TempSrc: Oral  PainSc: 5          Complications: No apparent anesthesia complications

## 2015-12-13 NOTE — Anesthesia Preprocedure Evaluation (Signed)
Anesthesia Evaluation  Patient identified by MRN, date of birth, ID band Patient awake    Reviewed: Allergy & Precautions, NPO status , Patient's Chart, lab work & pertinent test results  History of Anesthesia Complications (+) PONV  Airway Mallampati: II       Dental   Pulmonary asthma (allergy induced) ,           Cardiovascular hypertension, Pt. on medications negative cardio ROS       Neuro/Psych negative neurological ROS     GI/Hepatic Neg liver ROS, GERD  Medicated and Poorly Controlled,  Endo/Other  diabetes, Type 2, Oral Hypoglycemic AgentsHypothyroidism   Renal/GU negative Renal ROS     Musculoskeletal  (+) Arthritis , Osteoarthritis,    Abdominal   Peds  Hematology  (+) anemia ,   Anesthesia Other Findings   Reproductive/Obstetrics                             Anesthesia Physical Anesthesia Plan  ASA: III  Anesthesia Plan: Spinal   Post-op Pain Management:    Induction:   Airway Management Planned:   Additional Equipment:   Intra-op Plan:   Post-operative Plan:   Informed Consent: I have reviewed the patients History and Physical, chart, labs and discussed the procedure including the risks, benefits and alternatives for the proposed anesthesia with the patient or authorized representative who has indicated his/her understanding and acceptance.     Plan Discussed with:   Anesthesia Plan Comments:         Anesthesia Quick Evaluation

## 2015-12-13 NOTE — Anesthesia Procedure Notes (Signed)
Spinal  Patient location during procedure: OR Start time: 12/13/2015 2:00 PM End time: 12/13/2015 2:09 PM Staffing Resident/CRNA: Nelda Marseille Performed: resident/CRNA  Preanesthetic Checklist Completed: patient identified, site marked, surgical consent, pre-op evaluation, timeout performed, IV checked, risks and benefits discussed and monitors and equipment checked Spinal Block Patient position: sitting Prep: Betadine Patient monitoring: heart rate, continuous pulse ox, blood pressure and cardiac monitor Approach: midline Location: L3-4 Injection technique: single-shot Needle Needle type: Whitacre and Introducer  Needle gauge: 24 G Needle length: 9 cm Additional Notes Negative paresthesia. Negative blood return. Positive free-flowing CSF. Expiration date of kit checked and confirmed. Patient tolerated procedure well, without complications.

## 2015-12-13 NOTE — Op Note (Signed)
12/13/2015  4:13 PM  PATIENT:  Suzanne Munoz  50 y.o. female  PRE-OPERATIVE DIAGNOSIS:  primary osteoarthritis left hip  POST-OPERATIVE DIAGNOSIS:  primary osteoarthritis left hip  PROCEDURE:  Procedure(s): TOTAL HIP ARTHROPLASTY ANTERIOR APPROACH (Left)  SURGEON: Leitha SchullerMichael J Efstathios Sawin, MD  ASSISTANTS: None  ANESTHESIA:   spinal  EBL:  Total I/O In: 1600 [I.V.:1600] Out: 550 [Urine:250; Blood:300]  BLOOD ADMINISTERED:none  DRAINS: (2) Hemovact drain(s) in the Subcutaneous layer with  Suction Open   LOCAL MEDICATIONS USED:  MARCAINE     SPECIMEN:  Source of Specimen:  Left femoral head  DISPOSITION OF SPECIMEN:  PATHOLOGY  COUNTS:  YES  TOURNIQUET:  * No tourniquets in log *  IMPLANTS: Medacta AMIS standard 3 stem with S 28 mm head, 50 mm Mpact cup DM with liner  DICTATION: .Dragon Dictation   The patient was brought to the operating room and after spinal anesthesia was obtained patient was placed on the operative table with the ipsilateral foot into the Medacta attachment, contralateral leg on a well-padded table. C-arm was brought in and preop template x-ray taken. After prepping and draping in usual sterile fashion appropriate patient identification and timeout procedures were completed. Anterior approach to the hip was obtained and centered over the greater trochanter and TFL muscle. The subcutaneous tissue was incised hemostasis being achieved by electrocautery. TFL fascia was incised and the muscle retracted laterally deep retractor placed. The lateral femoral circumflex vessels were identified and ligated. The anterior capsule was exposed and a capsulotomy performed. The neck was identified and a femoral neck cut carried out with a saw. The head was removed without difficulty and showed sclerotic femoral head and acetabulum. Reaming was carried out to 50 mm and a 50 mm cup trial gave appropriate tightness to the acetabular component a 50 DM cup was impacted into position.  The leg was then externally rotated and ischiofemoral and pubofemoral releases carried out. The femur was sequentially broached to a size 3, size standard width S head trials were placed and the final components chosen. The 3 standard collared stem was inserted along with a S 28 mm head and 50 mm liner. The hip was reduced and was stable the wound was thoroughly irrigated with a dilute Betadine solution. The deep fascia was closed using a heavy Quill after infiltration of 30 cc of quarter percent Sensorcaine with epinephrine. Subcutaneous drains were then inserted. 3-0 V-loc subcutaneous with skin staples and Provena.  PLAN OF CARE: Admit to inpatient

## 2015-12-13 NOTE — Anesthesia Procedure Notes (Signed)
Date/Time: 02/22/2015 2:42 PM Performed by: Karilyn Wind Pre-anesthesia Checklist: Patient identified, Emergency Drugs available, Suction available, Patient being monitored and Timeout performed Oxygen Delivery Method: Simple face mask     

## 2015-12-14 ENCOUNTER — Encounter: Payer: Self-pay | Admitting: Orthopedic Surgery

## 2015-12-14 LAB — GLUCOSE, CAPILLARY
GLUCOSE-CAPILLARY: 192 mg/dL — AB (ref 65–99)
GLUCOSE-CAPILLARY: 248 mg/dL — AB (ref 65–99)
GLUCOSE-CAPILLARY: 199 mg/dL — AB (ref 65–99)
Glucose-Capillary: 193 mg/dL — ABNORMAL HIGH (ref 65–99)

## 2015-12-14 LAB — CBC
HCT: 30.5 % — ABNORMAL LOW (ref 35.0–47.0)
Hemoglobin: 10.4 g/dL — ABNORMAL LOW (ref 12.0–16.0)
MCH: 29.4 pg (ref 26.0–34.0)
MCHC: 34.2 g/dL (ref 32.0–36.0)
MCV: 86 fL (ref 80.0–100.0)
PLATELETS: 203 10*3/uL (ref 150–440)
RBC: 3.55 MIL/uL — AB (ref 3.80–5.20)
RDW: 13.9 % (ref 11.5–14.5)
WBC: 10.3 10*3/uL (ref 3.6–11.0)

## 2015-12-14 LAB — BASIC METABOLIC PANEL
ANION GAP: 7 (ref 5–15)
BUN: 10 mg/dL (ref 6–20)
CALCIUM: 8 mg/dL — AB (ref 8.9–10.3)
CO2: 25 mmol/L (ref 22–32)
CREATININE: 0.91 mg/dL (ref 0.44–1.00)
Chloride: 100 mmol/L — ABNORMAL LOW (ref 101–111)
Glucose, Bld: 224 mg/dL — ABNORMAL HIGH (ref 65–99)
Potassium: 4.1 mmol/L (ref 3.5–5.1)
SODIUM: 132 mmol/L — AB (ref 135–145)

## 2015-12-14 MED ORDER — TRAMADOL HCL 50 MG PO TABS
50.0000 mg | ORAL_TABLET | Freq: Four times a day (QID) | ORAL | Status: DC | PRN
  Administered 2015-12-14: 50 mg via ORAL
  Administered 2015-12-15 – 2015-12-16 (×4): 100 mg via ORAL
  Administered 2015-12-16: 50 mg via ORAL
  Filled 2015-12-14: qty 2
  Filled 2015-12-14: qty 1
  Filled 2015-12-14: qty 2
  Filled 2015-12-14: qty 1
  Filled 2015-12-14 (×3): qty 2

## 2015-12-14 NOTE — Care Management (Signed)
Cost of Lovenox is $ 65. Spouse updated

## 2015-12-14 NOTE — Care Management Note (Signed)
Case Management Note  Patient Details  Name: Suzanne Munoz MRN: [id number] Date of Birth: 06-16-1965  Subjective/Objective:  POD # 1 s/p left hip arthroplasty. Met with patient and her spouse at bedside.  Patient is drowsy and in pain during my visit. Plan is home after discharge. Spouse will be her care taker. Will need a walker. Ordered from [facility]. Patient has an elevated toilet seat. She would like to use [facility] for home health PT. Pharmacy: [facility] [address]   [phone]. Called Lovenox 40 mg, #14, no refills. Prior to admission she was ambulatory and independent.                  Action/Plan: Referral to [facility] for home health PT   Expected Discharge Date:  12/15/15               Expected Discharge Plan:  [place]  In-House Referral:     Discharge planning Services  CM Consult  Post Acute Care Choice:  Durable Medical Equipment, Home Health Choice offered to:  Patient, Spouse  DME Arranged:  [facility] DME Agency:     HH Arranged:  PT [place]:  [facility] (now [facility])  Status of Service:  In process, will continue to follow  If discussed at Long Length of Stay Meetings, dates discussed:    Additional Comments:  Jolly Mango, RN 12/14/2015, 9:48 AM

## 2015-12-14 NOTE — Evaluation (Signed)
Occupational Therapy Evaluation Patient Details Name: Suzanne Munoz MRN: [id number] DOB: 09-13-1965 Today's Date: 12/14/2015    History of Present Illness Pt. is a 50 y.o. female s/p Left THR.   Clinical Impression   Pt. is a 50 y.o. Female who [facility] with a Left THR. Pt. presents with lethargy, nausea, decreased activity tolerance, and decreased activity tolerance which hinder her ability to complete ADL tasks. Pt. could benefit from skilled OT services for ADL training, A/E training, UE ther. Ex., and pt. education about home modification, and DME. Pt. Plans to return home with husband upon discharge. Pt. Could benefit from follow-up OT services to work on improving ADLs/IADLs.    Follow Up Recommendations  Home health OT    Equipment Recommendations       Recommendations for Other Services       Precautions / Restrictions Precautions Precautions: Anterior Hip Precaution Booklet Issued: Yes (comment) Restrictions Weight Bearing Restrictions: Yes LLE Weight Bearing: Weight bearing as tolerated              ADL Overall ADL's : Needs assistance/impaired                                       General ADL Comments: Pt. and husband education was provided, and visual demonstration about A/E use for LE ADLs. However, lethargy, and nausea hindered learning, and carryover of information.     Vision     Perception     Praxis      Pertinent Vitals/Pain Pain Assessment: 0-10 Pain Score: 5  Pain Intervention(s): Limited activity within patient's tolerance;Monitored during session     Hand Dominance     Extremity/Trunk Assessment Upper Extremity Assessment Upper Extremity Assessment: Overall WFL for tasks assessed       Communication Communication Communication: No difficulties   Cognition Arousal/Alertness: Lethargic Behavior During Therapy: WFL for tasks assessed/performed Overall Cognitive Status: Within Functional  Limits for tasks assessed                     General Comments       Exercises Exercises: General Lower Extremity     Shoulder Instructions      Home Living Family/patient expects to be discharged to:: Private residence Living Arrangements: Spouse/significant other Available Help at Discharge: Family   Home Access: Level entry     Home Layout: Two level Alternate Level Stairs-Number of Steps: full flight   Bathroom Shower/Tub: Tub/shower unit;Curtain Shower/tub characteristics: Engineer, building servicesCurtain Bathroom Toilet: Standard     Home Equipment: Environmental consultantWalker - 2 wheels;Grab bars - toilet          Prior Functioning/Environment Level of Independence: Independent        Comments: Pt normally able to be very active,         OT Problem List: Decreased strength;Pain;Decreased activity tolerance;Decreased knowledge of use of DME or AE   OT Treatment/Interventions: Self-care/ADL training;Therapeutic exercise;Therapeutic activities;Energy conservation;Visual/perceptual remediation/compensation;Patient/family education;DME and/or AE instruction    OT Goals(Current goals can be found in the care plan section) Acute Rehab OT Goals Patient Stated Goal: To return home. OT Goal Formulation: With patient Potential to Achieve Goals: Good  OT Frequency: Min 1X/week   Barriers to D/C:            Co-evaluation              End of Session  Activity Tolerance: Patient limited by lethargy;Patient limited by fatigue;Patient limited by pain Patient left: in chair;with call bell/phone within reach;with chair alarm set   Time: [id number] OT Time Calculation (min): 15 min Charges:  OT General Charges $OT Visit: 1 Procedure OT Evaluation $OT Eval Low Complexity: 1 Procedure G-Codes:    Olegario MessierElaine Artia Singley, MS, OTR/L 12/14/2015, 10:39 AM

## 2015-12-14 NOTE — Progress Notes (Signed)
Clinical Social Worker (CSW) received SNF consult. PT is recommending home health. RN case manager is aware of above. Please reconsult if future social work needs arise. CSW signing off.   Amire Gossen, LCSW [phone] 

## 2015-12-14 NOTE — Evaluation (Signed)
Physical Therapy Evaluation Patient Details Name: Suzanne Munoz MRN: [id number] DOB: 1965/06/04 Today's Date: 12/14/2015   History of Present Illness  50 y/o female s/p L total hip replacement (anterior approach) on 11/9.  Clinical Impression  Pt shows good effort with most exercises and mobility, but is limited with pain and nausea t/o the eval.  She is eager to participate but with sitting and standing she had issues with dry heaving and generally feeling ill.  Pt was very limited with just 5 ft of ambulation because of this.  Pt still expects to go home but will need to get over her pain and nausea.      Follow Up Recommendations Home health PT (if she progresses as expected, pt quite limited on eval)    Equipment Recommendations  Rolling walker with 5" wheels    Recommendations for Other Services       Precautions / Restrictions Precautions Precautions: Anterior Hip Precaution Booklet Issued: Yes (comment) Restrictions Weight Bearing Restrictions: Yes LLE Weight Bearing: Weight bearing as tolerated      Mobility  Bed Mobility Overal bed mobility: Needs Assistance Bed Mobility: Supine to Sit     Supine to sit: Min assist     General bed mobility comments: Pt is slow to get to EOB and is nauseated and generally feeling poorly w/o the effort.  She did ultimately need light assist with L LE to get it off EOB  Transfers Overall transfer level: Needs assistance Equipment used: Rolling walker (2 wheeled) Transfers: Sit to/from Stand Sit to Stand: Min assist         General transfer comment: Pt attempted to stand X 1 w/o assist and was unable.  She did not need a lot of assist on the second effort but ultimately did need some.  Ambulation/Gait Ambulation/Gait assistance: Min assist Ambulation Distance (Feet): 5 Feet Assistive device: Rolling walker (2 wheeled)       General Gait Details: Pt was able to take a few steps but clearly was uncomfortable the  whole time and ultimately needed to stop secondary to dry heaving, nausea and generally feeling very poorly.  Stairs            Wheelchair Mobility    Modified Rankin (Stroke Patients Only)       Balance Overall balance assessment: Needs assistance   Sitting balance-Leahy Scale: Good Sitting balance - Comments: Pt nasueated and uncomfortable but able to maintain balance     Standing balance-Leahy Scale: Fair Standing balance comment: Limitations seem more related to nausea than actual balance issues.                              Pertinent Vitals/Pain Pain Assessment: 0-10 Pain Score: 6  (increases with all mobility and L hip movement)    Home Living Family/patient expects to be discharged to:: Private residence Living Arrangements: Spouse/significant other Available Help at Discharge: Family   Home Access: Level entry     Home Layout: Two level Home Equipment:  (borrowing walker, does not lock/is unsafe)      Prior Function Level of Independence: Independent         Comments: Pt normally able to be very active,      Hand Dominance        Extremity/Trunk Assessment   Upper Extremity Assessment: Overall WFL for tasks assessed           Lower Extremity Assessment: LLE deficits/detail  LLE Deficits / Details: grossly 3+/5, has considerable pain with all tasks     Communication   Communication: No difficulties  Cognition Arousal/Alertness: Awake/alert Behavior During Therapy: WFL for tasks assessed/performed Overall Cognitive Status: Within Functional Limits for tasks assessed                      General Comments      Exercises General Exercises - Lower Extremity Ankle Circles/Pumps: AROM;10 reps Quad Sets: Strengthening;10 reps Short Arc Quad: Strengthening;10 reps Heel Slides: AAROM;AROM;10 reps Hip ABduction/ADduction: AROM;10 reps Straight Leg Raises:  (unable)   Assessment/Plan    PT Assessment Patient  needs continued PT services  PT Problem List Decreased strength;Decreased activity tolerance;Decreased range of motion;Decreased balance;Decreased mobility;Decreased safety awareness;Decreased knowledge of precautions;Pain          PT Treatment Interventions DME instruction;Gait training;Stair training;Functional mobility training;Therapeutic activities;Therapeutic exercise;Balance training;Patient/family education;Neuromuscular re-education    PT Goals (Current goals can be found in the Care Plan section)  Acute Rehab PT Goals Patient Stated Goal: go home PT Goal Formulation: With patient/family Time For Goal Achievement: 12/28/15 Potential to Achieve Goals: Good    Frequency BID   Barriers to discharge        Co-evaluation               End of Session Equipment Utilized During Treatment: Gait belt Activity Tolerance: Patient limited by pain (limited secondary to nausea, dry heaving) Patient left: with family/visitor present;with chair alarm set;with call bell/phone within reach Nurse Communication: Mobility status;Patient requests pain meds         Time: 0747-0828 PT Time Calculation (min) (ACUTE ONLY): 41 min   Charges:   PT Evaluation $PT Eval Low Complexity: 1 Procedure PT Treatments $Therapeutic Exercise: 8-22 mins   PT G Codes:        Malachi ProGalen R Delvis Kau, DPT 12/14/2015, 10:18 AM

## 2015-12-14 NOTE — Progress Notes (Signed)
Physical Therapy Treatment Patient Details Name: Suzanne Munoz MRN: [id number] DOB: [birth year] Today's Date: 12/14/2015    History of Present Illness Pt. is a 50 y.o. female s/p Left THR.    PT Comments    Pt continues to be nauseated and have issues with getting sick during session.  She was clearly uncomfortable but also was able and willing to push herself despite this.  She was able to walk in the hallway with choppy, hesitant cadence, but ultimately was safe and able to work through her discomfort.  Pt pleasant t/o session despite feeling poorly.  Pt continues to be pain limited as well with 6/10 that increases with most therapeutic activities.  Follow Up Recommendations  Home health PT     Equipment Recommendations  Rolling walker with 5" wheels    Recommendations for Other Services       Precautions / Restrictions Precautions Precautions: Anterior Hip Precaution Booklet Issued: Yes (comment) Restrictions LLE Weight Bearing: Weight bearing as tolerated    Mobility  Bed Mobility Overal bed mobility: Needs Assistance Bed Mobility: Sit to Supine       Sit to supine: Mod assist   General bed mobility comments: Pt showed good effort but needed considerable assist to get LEs back into bed.    Transfers Overall transfer level: Modified independent Equipment used: Rolling walker (2 wheeled) Transfers: Sit to/from Stand Sit to Stand: Min guard         General transfer comment: Pt was able to rise easier this afternoon and though she was still feeling ill and nauseated she showed good effort getting to standing w/o direct physical assist.   Ambulation/Gait Ambulation/Gait assistance: Min assist;Min guard Ambulation Distance (Feet): 50 Feet Assistive device: Rolling walker (2 wheeled)       General Gait Details: Pt again had some nausea with standing/moving but was able to manage a decent walk this afternoon.  She was very slow and hesitant but ultimately  did not have any LOBs and despite some fatigue and feeling poorly she was able to walk to and minimally up/down the hallway.  She was very nauseated by the time and were done and vomited shortly after returning to sitting EOB.   Stairs            Wheelchair Mobility    Modified Rankin (Stroke Patients Only)       Balance Overall balance assessment: Modified Independent                                  Cognition Arousal/Alertness: Awake/alert Behavior During Therapy: WFL for tasks assessed/performed Overall Cognitive Status: Within Functional Limits for tasks assessed                      Exercises General Exercises - Lower Extremity Ankle Circles/Pumps: AROM;10 reps Quad Sets: Strengthening;10 reps Gluteal Sets: Strengthening;10 reps Short Arc Quad: Strengthening;10 reps Heel Slides: AAROM;AROM;10 reps Hip ABduction/ADduction: AROM;10 reps    General Comments        Pertinent Vitals/Pain Pain Score: 5     Home Living                      Prior Function            PT Goals (current goals can now be found in the care plan section) Progress towards PT goals: Progressing toward goals    Frequency  BID      PT Plan Current plan remains appropriate    Co-evaluation             End of Session Equipment Utilized During Treatment: Gait belt Activity Tolerance:  (nausea still a big factor with ability to fully participate.) Patient left: with bed alarm set;with call bell/phone within reach;with family/visitor present     Time: 1345-1425 PT Time Calculation (min) (ACUTE ONLY): 40 min  Charges:  $Gait Training: 8-22 mins $Therapeutic Exercise: 8-22 mins $Therapeutic Activity: 8-22 mins                    G Codes:      Malachi ProGalen R Rylon Poitra, DPT 12/14/2015, 3:55 PM

## 2015-12-14 NOTE — Anesthesia Postprocedure Evaluation (Signed)
Anesthesia Post Note  Patient: Suzanne Munoz  Procedure(s) Performed: Procedure(s) (LRB): TOTAL HIP ARTHROPLASTY ANTERIOR APPROACH (Left)  Patient location during evaluation: Nursing Unit Anesthesia Type: Spinal Level of consciousness: awake, awake and alert and oriented Pain management: pain level controlled Vital Signs Assessment: post-procedure vital signs reviewed and stable Respiratory status: spontaneous breathing, nonlabored ventilation and respiratory function stable Cardiovascular status: blood pressure returned to baseline and stable Postop Assessment: no headache and no backache Anesthetic complications: no    Last Vitals:  Vitals:   12/14/15 0005 12/14/15 0407  BP: (!) 114/56 (!) 120/58  Pulse: 81 82  Resp: 16 18  Temp: 37.2 C 37 C    Last Pain:  Vitals:   12/14/15 0521  TempSrc:   PainSc: 3                  Masco CorporationStephanie Ayah Cozzolino

## 2015-12-14 NOTE — Progress Notes (Signed)
   Subjective: 1 Day Post-Op Procedure(s) (LRB): TOTAL HIP ARTHROPLASTY ANTERIOR APPROACH (Left) Patient reports pain as 6 on 0-10 scale.   Patient is well, and has had no acute complaints or problems Denies any CP, SOB, ABD pain. We will continue therapy today.  Plan is to go Home after hospital stay.  Objective: Vital signs in last 24 hours: Temp:  [97.2 F (36.2 C)-100.1 F (37.8 C)] 97.8 F (36.6 C) (11/10 0753) Pulse Rate:  [74-104] 89 (11/10 0753) Resp:  [14-25] 16 (11/10 0753) BP: (93-146)/(40-75) 131/68 (11/10 0753) SpO2:  [96 %-100 %] 97 % (11/10 0753) FiO2 (%):  [21 %] 21 % (11/09 1731) Weight:  [97.4 kg (214 lb 12.8 oz)-98.4 kg (217 lb)] 97.4 kg (214 lb 12.8 oz) (11/09 1736)  Intake/Output from previous day: 11/09 0701 - 11/10 0700 In: 1610.93698.3 [P.O.:720; I.V.:2778.3; IV Piggyback:200] Out: 2530 [Urine:2200; Drains:30; Blood:300] Intake/Output this shift: No intake/output data recorded.   Recent Labs  12/13/15 1828 12/14/15 0329  HGB 11.6* 10.4*    Recent Labs  12/13/15 1828 12/14/15 0329  WBC 9.1 10.3  RBC 3.97 3.55*  HCT 33.9* 30.5*  PLT 214 203    Recent Labs  12/13/15 1828 12/14/15 0329  NA  --  132*  K  --  4.1  CL  --  100*  CO2  --  25  BUN  --  10  CREATININE 0.75 0.91  GLUCOSE  --  224*  CALCIUM  --  8.0*   No results for input(s): LABPT, INR in the last 72 hours.  EXAM General - Patient is Alert, Appropriate and Oriented Extremity - Neurovascular intact Sensation intact distally Intact pulses distally Dorsiflexion/Plantar flexion intact No cellulitis present Compartment soft Dressing - dressing C/D/I, hemovac intact Motor Function - intact, moving foot and toes well on exam.   Past Medical History:  Diagnosis Date  . Anemia   . Diabetes mellitus without complication (HCC)   . GERD (gastroesophageal reflux disease)   . Hypothyroidism   . PONV (postoperative nausea and vomiting)   . Thyroid disease      Assessment/Plan:   1 Day Post-Op Procedure(s) (LRB): TOTAL HIP ARTHROPLASTY ANTERIOR APPROACH (Left) Active Problems:   Primary localized osteoarthritis of left hip  Estimated body mass index is 33.64 kg/m as calculated from the following:   Height as of this encounter: 5\' 7"  (1.702 m).   Weight as of this encounter: 97.4 kg (214 lb 12.8 oz). Advance diet Up with therapy  Needs BM Recheck labs in the am CM to assist with discharge  DVT Prophylaxis - Lovenox, Foot Pumps and TED hose Weight-Bearing as tolerated to left leg   T. Cranston Neighborhris Stephania Macfarlane, PA-C [facility] 12/14/2015, 8:10 AM

## 2015-12-14 NOTE — Progress Notes (Signed)
Pt requested pain med for pain level 6/10, received oxycodone po.

## 2015-12-14 NOTE — Progress Notes (Deleted)
Patient was discharged home with home health. Reviewed scripts, instructions, and lovenox kit. Daughter was at bedside during discharge. IVs removed with caths intact. Belongings sent with patient.

## 2015-12-15 LAB — GLUCOSE, CAPILLARY
GLUCOSE-CAPILLARY: 150 mg/dL — AB (ref 65–99)
Glucose-Capillary: 174 mg/dL — ABNORMAL HIGH (ref 65–99)
Glucose-Capillary: 234 mg/dL — ABNORMAL HIGH (ref 65–99)
Glucose-Capillary: 122 mg/dL — ABNORMAL HIGH (ref 65–99)

## 2015-12-15 LAB — BASIC METABOLIC PANEL
Anion gap: 5 (ref 5–15)
BUN: 7 mg/dL (ref 6–20)
CHLORIDE: 104 mmol/L (ref 101–111)
CO2: 28 mmol/L (ref 22–32)
CREATININE: 0.74 mg/dL (ref 0.44–1.00)
Calcium: 8 mg/dL — ABNORMAL LOW (ref 8.9–10.3)
GFR calc Af Amer: >60 mL/min (ref >60)
GFR calc non Af Amer: >60 mL/min (ref >60)
GLUCOSE: 181 mg/dL — AB (ref 65–99)
POTASSIUM: 3.8 mmol/L (ref 3.5–5.1)
SODIUM: 137 mmol/L (ref 135–145)

## 2015-12-15 LAB — CBC
HEMATOCRIT: 29.2 % — AB (ref 35.0–47.0)
HEMOGLOBIN: 10.1 g/dL — AB (ref 12.0–16.0)
MCH: 29.6 pg (ref 26.0–34.0)
MCHC: 34.6 g/dL (ref 32.0–36.0)
MCV: 85.7 fL (ref 80.0–100.0)
Platelets: 180 10*3/uL (ref 150–440)
RBC: 3.4 MIL/uL — AB (ref 3.80–5.20)
RDW: 14.1 % (ref 11.5–14.5)
WBC: 8.1 10*3/uL (ref 3.6–11.0)

## 2015-12-15 NOTE — Progress Notes (Signed)
Occupational Therapy Treatment Patient Details Name: Suzanne Munoz MRN: [id number] DOB: 12/18/1965 Today's Date: 12/15/2015    History of present illness Pt. is a 50 y.o. female s/p Left THR.   OT comments  Patient was seen this date for OT tx with focus on self care activities including toileting, dressing and grooming along with functional mobility.  Patient requires assistance with lower body dressing min assist with adaptive equipment otherwise max assist.  Sit to stand with min guard, toilet transfers with elevated seat, min guard.  Patient able to complete grooming in standing with min guard.  Patient will have assistance at home for several weeks upon discharge and is unsure if she will need adaptive equipment.  Her husband did purchase an elevated toilet seat for home.  Continue to work with patient during her hospital stay.  Pain 5/10 in left hip this date.   Follow Up Recommendations  No OT follow up    Equipment Recommendations       Recommendations for Other Services      Precautions / Restrictions Precautions Precautions: Anterior Hip Restrictions Weight Bearing Restrictions: Yes LLE Weight Bearing: Weight bearing as tolerated       Mobility Bed Mobility                  Transfers Overall transfer level: Needs assistance Equipment used: Rolling walker (2 wheeled) Transfers: Sit to/from Stand Sit to Stand: Min guard              Balance                                   ADL Overall ADL's : Needs assistance/impaired Eating/Feeding: Independent   Grooming: Modified independent;Sitting Grooming Details (indicate cue type and reason): min guard in standing         Upper Body Dressing : Set up   Lower Body Dressing: Set up;Minimal assistance;With adaptive equipment Lower Body Dressing Details (indicate cue type and reason): Patient will have assistance at home for a couple of weeks, educated on adaptive equipment with sock  aid, reacher, shoe horn and sponge. Toilet Transfer: LawyerMin guard   Toileting- Clothing Manipulation and Hygiene: Min guard       Functional mobility during ADLs: Min guard  Educated on home safety issues.        Vision                     Perception     Praxis      Cognition   Behavior During Therapy: WFL for tasks assessed/performed Overall Cognitive Status: Within Functional Limits for tasks assessed                       Extremity/Trunk Assessment               Exercises     Shoulder Instructions       General Comments      Pertinent Vitals/ Pain       Pain Assessment: 0-10 Pain Score: 5  Pain Descriptors / Indicators: Aching Pain Intervention(s): Limited activity within patient's tolerance;Repositioned;Monitored during session  Home Living                                          Prior Functioning/Environment  Frequency  Min 1X/week        Progress Toward Goals  OT Goals(current goals can now be found in the care plan section)  Progress towards OT goals: Progressing toward goals  Acute Rehab OT Goals Patient Stated Goal: To return home. OT Goal Formulation: With patient Potential to Achieve Goals: Good  Plan      Co-evaluation                 End of Session Equipment Utilized During Treatment: Gait belt   Activity Tolerance Patient limited by lethargy;Patient limited by fatigue;Patient limited by pain   Patient Left in chair;with call bell/phone within reach;with chair alarm set   Nurse Communication      Self Care Current Status ([id number]): At least 1 percent but less than 20 percent impaired, limited or restricted   Time: 1120-1204 OT Time Calculation (min): 44 min  Charges: OT G-codes **NOT FOR INPATIENT CLASS** Self Care Current Status ([id number]): At least 1 percent but less than 20 percent impaired, limited or restricted OT General Charges $OT Visit: 1 Procedure OT  Treatments $Self Care/Home Management : 38-52 mins  Ashley Bultema  Lydia Meng T Draeden Kellman, OTR/L, CLT  12/15/2015, 12:09 PM

## 2015-12-15 NOTE — Progress Notes (Addendum)
   Subjective: 2 Days Post-Op Procedure(s) (LRB): TOTAL HIP ARTHROPLASTY ANTERIOR APPROACH (Left) Patient reports pain as moderate.   Patient is well, and has had no acute complaints or problems Denies any CP, SOB, ABD pain. We will continue therapy today.  Plan is to go Home after hospital stay.  Objective: Vital signs in last 24 hours: Temp:  [98.4 F (36.9 C)-98.7 F (37.1 C)] 98.6 F (37 C) (11/11 0734) Pulse Rate:  [92-98] 98 (11/11 0734) Resp:  [18] 18 (11/11 0734) BP: (111-136)/(60-67) 133/65 (11/11 0734) SpO2:  [98 %-100 %] 98 % (11/11 0734)  Intake/Output from previous day: 11/10 0701 - 11/11 0700 In: 3085 [P.O.:720; I.V.:2365] Out: 1950 [Urine:1950] Intake/Output this shift: No intake/output data recorded.   Recent Labs  12/13/15 1828 12/14/15 0329 12/15/15 0412  HGB 11.6* 10.4* 10.1*    Recent Labs  12/14/15 0329 12/15/15 0412  WBC 10.3 8.1  RBC 3.55* 3.40*  HCT 30.5* 29.2*  PLT 203 180    Recent Labs  12/14/15 0329 12/15/15 0412  NA 132* 137  K 4.1 3.8  CL 100* 104  CO2 25 28  BUN 10 7  CREATININE 0.91 0.74  GLUCOSE 224* 181*  CALCIUM 8.0* 8.0*   No results for input(s): LABPT, INR in the last 72 hours.  EXAM General - Patient is Alert, Appropriate and Oriented Extremity - Neurovascular intact Sensation intact distally Intact pulses distally Dorsiflexion/Plantar flexion intact No cellulitis present Compartment soft Hemovac removed Dressing - dressing C/D/I and no drainage, Wound VAC intact. Motor Function - intact, moving foot and toes well on exam.   Past Medical History:  Diagnosis Date  . Anemia   . Diabetes mellitus without complication (HCC)   . GERD (gastroesophageal reflux disease)   . Hypothyroidism   . PONV (postoperative nausea and vomiting)   . Thyroid disease     Assessment/Plan:   2 Days Post-Op Procedure(s) (LRB): TOTAL HIP ARTHROPLASTY ANTERIOR APPROACH (Left) Active Problems:   Primary localized  osteoarthritis of left hip  Estimated body mass index is 33.64 kg/m as calculated from the following:   Height as of this encounter: 5\' 7"  (1.702 m).   Weight as of this encounter: 97.4 kg (214 lb 12.8 oz). Advance diet Up with therapy  Needs bowel movement Acute post op blood loss anemia - Hgb stable 10.1. Recheck labs in the am Plan on discharge to home with home health PT tomorrow.  DVT Prophylaxis - Lovenox, Foot Pumps and TED hose Weight-Bearing as tolerated to left leg   T. Cranston Neighborhris Gaines, PA-C [facility] 12/15/2015, 9:23 AM

## 2015-12-15 NOTE — Progress Notes (Signed)
Physical Therapy Treatment Patient Details Name: Suzanne Munoz MRN: [id number] DOB: 1965/07/25 Today's Date: 12/15/2015    History of Present Illness Pt. is a 50 y.o. female s/p Left THR.    PT Comments    Pt initially extremely stiff/sore in L hip/quad but showed great effort and motivation to work through the discomfort with exercises and then ambulation.  She showed great motivation and and despite pain was able to walk the entire loop and perform steps.  She had significantly more mobility and confidence today and did show some increased tolerance after initially being very pain sensitive/limited. No nausea today.  Follow Up Recommendations  Home health PT     Equipment Recommendations  Rolling walker with 5" wheels    Recommendations for Other Services       Precautions / Restrictions Precautions Precautions: Anterior Hip Restrictions Weight Bearing Restrictions: Yes LLE Weight Bearing: Weight bearing as tolerated    Mobility  Bed Mobility               General bed mobility comments: pt in recliner on arrival  Transfers Overall transfer level: Modified independent Equipment used: Rolling walker (2 wheeled) Transfers: Sit to/from Stand Sit to Stand: Min guard         General transfer comment: Pt needing cues for foot and hand placement, but was able to rise w/o direct assist  Ambulation/Gait Ambulation/Gait assistance: Min guard Ambulation Distance (Feet): 250 Feet Assistive device: Rolling walker (2 wheeled)       General Gait Details: Pt initially struggled to even advance L LE and was dragging/shuffling it forward.  She was also very reliant on the walker and seemed very uncomfortable with WBing.  After ~50 ft with great effort she was able to pick foot up to advance it and eventually (with direct assist from PT) was able to maintain slow but constant walker momuntum with gradually increasing WBing tolerance and ability to advance  LE.   Stairs Stairs: Yes Stairs assistance: Supervision Stair Management: One rail Right;One rail Left;Step to pattern;Sideways Number of Stairs: 8 General stair comments: up/down 4 steps with L then R rail.  She did well, showed good relative confidence and safety   Wheelchair Mobility    Modified Rankin (Stroke Patients Only)       Balance Overall balance assessment: Modified Independent                                  Cognition Arousal/Alertness: Awake/alert Behavior During Therapy: WFL for tasks assessed/performed Overall Cognitive Status: Within Functional Limits for tasks assessed                      Exercises General Exercises - Lower Extremity Ankle Circles/Pumps: AROM;10 reps Quad Sets: Strengthening;10 reps Gluteal Sets: Strengthening;10 reps Long Arc Quad: AROM;10 reps Hip ABduction/ADduction: AROM;10 reps Hip Flexion/Marching: AAROM;10 reps    General Comments        Pertinent Vitals/Pain Pain Assessment: 0-10 Pain Score: 6  Pain Descriptors / Indicators: Aching Pain Intervention(s): Limited activity within patient's tolerance;Repositioned;Monitored during session    Home Living                      Prior Function            PT Goals (current goals can now be found in the care plan section) Acute Rehab PT Goals Patient Stated Goal: To  return home. Progress towards PT goals: Progressing toward goals    Frequency    BID      PT Plan Current plan remains appropriate    Co-evaluation             End of Session Equipment Utilized During Treatment: Gait belt Activity Tolerance: Patient limited by pain Patient left: with chair alarm set;with call bell/phone within reach;with family/visitor present     Time: [id number] PT Time Calculation (min) (ACUTE ONLY): 43 min  Charges:  $Gait Training: 23-37 mins $Therapeutic Exercise: 8-22 mins                    G Codes:      Malachi ProGalen R Graycen Degan,  DPT 12/15/2015, 1:30 PM

## 2015-12-15 NOTE — Progress Notes (Signed)
Physical Therapy Treatment Patient Details Name: Suzanne Munoz D Flood-Coner MRN: [id number] DOB: Aug 23, 1965 Today's Date: 12/15/2015    History of Present Illness Pt. is a 50 y.o. female s/p Left THR.    PT Comments    Pt continues to work very hard with PT sessions and is showing consistent improvement.  She was able to do 2 laps around the nurses' station and had relatively confident and consistent cadence and greatly improved gait speed this afternoon.  Pt is still stiff and sore in L hip flexor area and is unable to do SLRs but mobility has been consistently getting better and pt is highly motivated.  Pt doing well and should be able to go home tomorrow w/o issue.   Follow Up Recommendations  Home health PT     Equipment Recommendations  Rolling walker with 5" wheels    Recommendations for Other Services       Precautions / Restrictions Precautions Precautions: Anterior Hip Restrictions LLE Weight Bearing: Weight bearing as tolerated    Mobility  Bed Mobility Overal bed mobility: Modified Independent Bed Mobility: Sit to Supine       Sit to supine: Min guard;Min assist   General bed mobility comments: Pt with very slow, labored effort getting L LE into bed (used R LE to assist and holding onto husband as opposed to rails) but she was able to get LEs up into bed   Transfers Overall transfer level: Modified independent Equipment used: Rolling walker (2 wheeled) Transfers: Sit to/from Stand Sit to Stand: Min guard         General transfer comment: Pt showed increased confidence in getting herself up to standing and showed good balance and improved posture with and w/o the AD  Ambulation/Gait Ambulation/Gait assistance: Min guard Ambulation Distance (Feet): 350 Feet Assistive device: Rolling walker (2 wheeled)       General Gait Details: Pt needed relatively few "warm up" steps to be able to advance LLE well and to maintain consistent steps length/cadence to allow  consistent walker momentum.  She had no safety issues and despite some fatigue she overall did very well and showed great motivation doing 2 loops around the nurses' station.   Stairs Stairs: Yes Stairs assistance: Supervision Stair Management: One rail Right;One rail Left;Step to pattern;Sideways Number of Stairs: 8 General stair comments: up/down 4 steps with L then R rail.  She did well, showed good relative confidence and safety   Wheelchair Mobility    Modified Rankin (Stroke Patients Only)       Balance Overall balance assessment: Modified Independent                                  Cognition Arousal/Alertness: Awake/alert Behavior During Therapy: WFL for tasks assessed/performed Overall Cognitive Status: Within Functional Limits for tasks assessed                      Exercises General Exercises - Lower Extremity Ankle Circles/Pumps: AROM;10 reps Quad Sets: 15 reps;Strengthening Gluteal Sets: 15 reps;Strengthening Short Arc Quad: Strengthening;10 reps Long Arc Quad: AROM;10 reps Heel Slides: AROM;10 reps (light hip flexion resistance at the top) Hip ABduction/ADduction: 15 reps;Strengthening Hip Flexion/Marching: AAROM;10 reps    General Comments        Pertinent Vitals/Pain Pain Assessment: 0-10 Pain Score: 5  (increases with activity/exercises) Pain Descriptors / Indicators: Aching Pain Intervention(s): Limited activity within patient's tolerance;Repositioned;Monitored during session  Home Living                      Prior Function            PT Goals (current goals can now be found in the care plan section) Acute Rehab PT Goals Patient Stated Goal: To return home. Progress towards PT goals: Progressing toward goals    Frequency    BID      PT Plan Current plan remains appropriate    Co-evaluation             End of Session Equipment Utilized During Treatment: Gait belt Activity Tolerance: Patient  limited by pain Patient left: with bed alarm set;with call bell/phone within reach;with family/visitor present     Time: 1245-1313 PT Time Calculation (min) (ACUTE ONLY): 28 min  Charges:  $Gait Training: 8-22 mins $Therapeutic Exercise: 8-22 mins                    G Codes:      Malachi ProGalen R Anneli Bing, DPT 12/15/2015, 4:06 PM

## 2015-12-16 LAB — BASIC METABOLIC PANEL
Anion gap: 5 (ref 5–15)
BUN: 7 mg/dL (ref 6–20)
CO2: 30 mmol/L (ref 22–32)
Calcium: 8.5 mg/dL — ABNORMAL LOW (ref 8.9–10.3)
Chloride: 101 mmol/L (ref 101–111)
Creatinine, Ser: 0.82 mg/dL (ref 0.44–1.00)
GFR calc Af Amer: >60 mL/min (ref >60)
GLUCOSE: 164 mg/dL — AB (ref 65–99)
POTASSIUM: 3.6 mmol/L (ref 3.5–5.1)
Sodium: 136 mmol/L (ref 135–145)

## 2015-12-16 LAB — CBC
HCT: 30.4 % — ABNORMAL LOW (ref 35.0–47.0)
Hemoglobin: 10.2 g/dL — ABNORMAL LOW (ref 12.0–16.0)
MCH: 29 pg (ref 26.0–34.0)
MCHC: 33.5 g/dL (ref 32.0–36.0)
MCV: 86.5 fL (ref 80.0–100.0)
PLATELETS: 202 10*3/uL (ref 150–440)
RBC: 3.51 MIL/uL — AB (ref 3.80–5.20)
RDW: 13.7 % (ref 11.5–14.5)
WBC: 8 10*3/uL (ref 3.6–11.0)

## 2015-12-16 LAB — GLUCOSE, CAPILLARY
Glucose-Capillary: 172 mg/dL — ABNORMAL HIGH (ref 65–99)
Glucose-Capillary: 177 mg/dL — ABNORMAL HIGH (ref 65–99)

## 2015-12-16 MED ORDER — ENOXAPARIN SODIUM 40 MG/0.4ML ~~LOC~~ SOLN
40.0000 mg | SUBCUTANEOUS | 0 refills | Status: DC
Start: 2015-12-16 — End: 2016-09-18

## 2015-12-16 MED ORDER — OXYCODONE HCL 5 MG PO TABS: 5.0000 mg | ORAL_TABLET | ORAL | 0 refills | Status: DC | PRN

## 2015-12-16 MED ORDER — TRAMADOL HCL 50 MG PO TABS: 50.0000 mg | ORAL_TABLET | Freq: Four times a day (QID) | ORAL | 0 refills | Status: DC | PRN

## 2015-12-16 NOTE — Care Management Note (Signed)
Case Management Note  Patient Details  Name: Suzanne Munoz MRN: [id number] Date of Birth: 08/14/1965  Subjective/Objective:       A referral was called to Shea Stakesona George at [facility] requesting home health PT services. [name] already has a RW. He Lovenox co-pay is $65 and she is aware.            Action/Plan:   Expected Discharge Date:  12/15/15               Expected Discharge Plan:  Home w Home Health Services  In-House Referral:     Discharge planning Services  CM Consult  Post Acute Care Choice:  Durable Medical Equipment, Home Health Choice offered to:  Patient, Spouse  DME Arranged:  Dan HumphreysWalker DME Agency:     HH Arranged:  [facility]  Status of Service:  In process, will continue to follow  If discussed at Long Length of Stay Meetings, dates discussed:    Additional Comments:  Jasmyne Lodato A, RN 12/16/2015, 9:23 AM

## 2015-12-16 NOTE — Discharge Summary (Signed)
Physician Discharge Summary  Patient ID: Tandi Hanko Flood-Coner MRN: [id number] DOB/AGE: 03/10/1965 50 y.o.  Admit date: 12/13/2015 Discharge date: 12/16/2015  Admission Diagnoses:  primary osteoarthritis   Discharge Diagnoses: Patient Active Problem List   Diagnosis Date Noted  . Primary localized osteoarthritis of left hip 12/13/2015  . Controlled type 2 diabetes mellitus without complication (HCC) 12/19/2014  . Arthralgia of hip 10/19/2014  . Left leg pain 10/19/2014  . Adnexal pain 10/19/2014  . Degenerative arthritis of hip 12/20/2013  . B12 deficiency 10/19/2013  . Iron deficiency 10/19/2013  . Adiposity 10/19/2013  . Degeneration of intervertebral disc of lumbar region 07/21/2013  . Neuritis or radiculitis due to rupture of lumbar intervertebral disc 07/21/2013  . Trochanteric bursitis of left hip 07/21/2013  . Diabetes mellitus (HCC) 05/12/2013  . Brash 05/12/2013  . HLD (hyperlipidemia) 05/12/2013  . Adult hypothyroidism 05/12/2013  . Adaptive colitis 05/12/2013  . Reflux 03/20/2011    Past Medical History:  Diagnosis Date  . Anemia   . Diabetes mellitus without complication (HCC)   . GERD (gastroesophageal reflux disease)   . Hypothyroidism   . PONV (postoperative nausea and vomiting)   . Thyroid disease      Transfusion: none   Consultants (if any):   Discharged Condition: Improved  Hospital Course: Jemima D Flood-Coner is an 50 y.o. female who was admitted 12/13/2015 with a diagnosis of <principal problem not specified> and went to the operating room on 12/13/2015 and underwent the above named procedures.    Surgeries: Procedure(s): TOTAL HIP ARTHROPLASTY ANTERIOR APPROACH on 12/13/2015 Patient tolerated the surgery well. Taken to PACU where she was stabilized and then transferred to the orthopedic floor.  Started on Lovenox 40 q 24 hrs. Foot pumps applied bilaterally at 80 mm. Heels elevated on bed with rolled towels. No evidence of DVT. Negative  Homan. Physical therapy started on day #1 for gait training and transfer. OT started day #1 for ADL and assisted devices.  Patient's foley was d/c on day #1. Patient's IV and hemovac was d/c on day #2.  On post op day #3 patient was stable and ready for discharge to home with HHPT.  Implants: Medacta AMIS standard 3 stem with S 28 mm head, 50 mm Mpact cup DM with liner  She was given perioperative antibiotics:  Anti-infectives    Start     Dose/Rate Route Frequency Ordered Stop   12/13/15 2000  ceFAZolin (ANCEF) IVPB 2g/100 mL premix     2 g 200 mL/hr over 30 Minutes Intravenous Every 6 hours 12/13/15 1730 12/14/15 0830   12/13/15 1108  ceFAZolin (ANCEF) 2-4 GM/100ML-% IVPB    Comments:  JOYCE, HEATHER: cabinet override      12/13/15 1108 12/13/15 1413   12/13/15 0200  ceFAZolin (ANCEF) IVPB 2g/100 mL premix     2 g 200 mL/hr over 30 Minutes Intravenous  Once 12/13/15 0147 12/13/15 1428    .  She was given sequential compression devices, early ambulation, and Lovenox for DVT prophylaxis.  She benefited maximally from the hospital stay and there were no complications.    Recent vital signs:  Vitals:   12/15/15 1937 12/16/15 0408  BP: 130/62 122/60  Pulse: 95 86  Resp: 18 16  Temp: 99 F (37.2 C) 97.9 F (36.6 C)    Recent laboratory studies:  Lab Results  Component Value Date   HGB 10.2 (L) 12/16/2015   HGB 10.1 (L) 12/15/2015   HGB 10.4 (L) 12/14/2015   Lab Results  Component Value Date   WBC 8.0 12/16/2015   PLT 202 12/16/2015   Lab Results  Component Value Date   INR 0.84 12/06/2015   Lab Results  Component Value Date   NA 136 12/16/2015   K 3.6 12/16/2015   CL 101 12/16/2015   CO2 30 12/16/2015   BUN 7 12/16/2015   CREATININE 0.82 12/16/2015   GLUCOSE 164 (H) 12/16/2015    Discharge Medications:     Medication List    TAKE these medications   ALLEGRA PO Take 1 tablet by mouth daily as needed (allergies).   B-12 PO Take 1 tablet by mouth  daily.   enoxaparin 40 MG/0.4ML injection Commonly known as:  LOVENOX Inject 0.4 mLs (40 mg total) into the skin daily.   fluticasone 50 MCG/ACT nasal spray Commonly known as:  FLONASE Place 1 spray into both nostrils daily as needed for allergies or rhinitis.   glimepiride 1 MG tablet Commonly known as:  AMARYL Take 1 mg by mouth daily as needed.   hyoscyamine 0.125 MG tablet Commonly known as:  LEVSIN, ANASPAZ TAKE 1 TABLET (0.125 MG TOTAL) BY MOUTH EVERY 4 (FOUR) HOURS AS NEEDED FOR CRAMPING.   levalbuterol 45 MCG/ACT inhaler Commonly known as:  XOPENEX HFA Inhale 2 puffs into the lungs every 4 (four) hours as needed for wheezing.   lisinopril 5 MG tablet Commonly known as:  PRINIVIL,ZESTRIL Take 5 mg by mouth daily.   metFORMIN 500 MG 24 hr tablet Commonly known as:  GLUCOPHAGE-XR Take 1,000 mg by mouth 2 (two) times daily.   multivitamin with minerals Tabs tablet Take 1 tablet by mouth daily.   OVER THE COUNTER MEDICATION Take 1 tablet by mouth daily. replenix   OVER THE COUNTER MEDICATION Take 1 capsule by mouth 2 (two) times daily. nutra therm stimulant free fat burner   oxyCODONE 5 MG immediate release tablet Commonly known as:  Oxy IR/ROXICODONE Take 1-2 tablets (5-10 mg total) by mouth every 3 (three) hours as needed for breakthrough pain.   ranitidine 150 MG tablet Commonly known as:  ZANTAC Take 150 mg by mouth 2 (two) times daily.   simvastatin 40 MG tablet Commonly known as:  ZOCOR Take 40 mg by mouth at bedtime.   SYNTHROID 137 MCG tablet Generic drug:  levothyroxine TAKE 1 TABLET BY MOUTH 30-60 MINUTES BEFORE BREAKFAST WITH A FULL GLASS OF WATER   traMADol 50 MG tablet Commonly known as:  ULTRAM Take 1-2 tablets (50-100 mg total) by mouth every 6 (six) hours as needed for moderate pain (alternate with oxycodone).            Durable Medical Equipment        Start     Ordered   12/13/15 1731  DME Walker rolling  Once     12/13/15  1730   12/13/15 1731  DME 3 n 1  Once     12/13/15 1730   12/13/15 1731  DME Bedside commode  Once     12/13/15 1730      Diagnostic Studies: Dg Hip Operative Unilat W Or W/o Pelvis Left  Result Date: 12/13/2015 CLINICAL DATA:  Left total hip replacement EXAM: OPERATIVE left HIP (WITH PELVIS IF PERFORMED) 3 VIEWS TECHNIQUE: Fluoroscopic spot image(s) were submitted for interpretation post-operatively. COMPARISON:  None. FINDINGS: Left total hip replacement components appear to be in good position with no complicating features noted. IMPRESSION: Left total hip replacement with no complicating features. Electronically Signed   By: Renae FicklePaul  Gery PrayBarry M.D.   On: 12/13/2015 15:47   Dg Hip Unilat W Or W/o Pelvis 2-3 Views Left  Result Date: 12/13/2015 CLINICAL DATA:  This status post left hip arthroplasty. Postoperative images. EXAM: DG HIP (WITH OR WITHOUT PELVIS) 2-3V LEFT COMPARISON:  None. FINDINGS: The left hip prosthetic components appear well seated and well-aligned. There is no acute fracture or evidence of an operative complication. IMPRESSION: Well-positioned left hip prosthesis. Electronically Signed   By: Amie Portlandavid  Ormond M.D.   On: 12/13/2015 16:41    Disposition: 01-Home or Self Care    Follow-up Information    MENZ,MICHAEL, MD Follow up in 2 week(s).   Specialty:  Orthopedic Surgery Why:  For staple removal. Contact information: [address] [facility] [place] [address] [phone]            Signed: Amador CunasGAINES, Adler Al[place] [facility] Hospital[facility]RISTOPHER 12/16/2015, 7:26 AM

## 2015-12-16 NOTE — Care Management Important Message (Signed)
Important Message  Patient Details  Name: Suzanne Munoz MRN: [id number] Date of Birth: 1965/09/21   Medicare Important Message Given:       Jolee EwingRockett,Dewaun Kinzler A, RN 12/16/2015, 2:54 PM

## 2015-12-16 NOTE — Discharge Instructions (Signed)

## 2015-12-16 NOTE — Progress Notes (Signed)
   Subjective: 3 Days Post-Op Procedure(s) (LRB): TOTAL HIP ARTHROPLASTY ANTERIOR APPROACH (Left) Patient reports pain as mild.   Patient is well, and has had no acute complaints or problems Denies any CP, SOB, ABD pain. We will continue therapy today.  Plan is to go Home after hospital stay.  Objective: Vital signs in last 24 hours: Temp:  [97.9 F (36.6 C)-99 F (37.2 C)] 97.9 F (36.6 C) (11/12 0408) Pulse Rate:  [86-99] 86 (11/12 0408) Resp:  [16-18] 16 (11/12 0408) BP: (122-134)/(60-67) 122/60 (11/12 0408) SpO2:  [98 %-100 %] 99 % (11/12 0408)  Intake/Output from previous day: 11/11 0701 - 11/12 0700 In: 1080 [P.O.:1080] Out: -  Intake/Output this shift: No intake/output data recorded.   Recent Labs  12/13/15 1828 12/14/15 0329 12/15/15 0412 12/16/15 0356  HGB 11.6* 10.4* 10.1* 10.2*    Recent Labs  12/15/15 0412 12/16/15 0356  WBC 8.1 8.0  RBC 3.40* 3.51*  HCT 29.2* 30.4*  PLT 180 202    Recent Labs  12/15/15 0412 12/16/15 0356  NA 137 136  K 3.8 3.6  CL 104 101  CO2 28 30  BUN 7 7  CREATININE 0.74 0.82  GLUCOSE 181* 164*  CALCIUM 8.0* 8.5*   No results for input(s): LABPT, INR in the last 72 hours.  EXAM General - Patient is Alert, Appropriate and Oriented Extremity - Neurovascular intact Sensation intact distally Intact pulses distally Dorsiflexion/Plantar flexion intact No cellulitis present Compartment soft Dressing - dressing C/D/I, wound vac intact Motor Function - intact, moving foot and toes well on exam.   Past Medical History:  Diagnosis Date  . Anemia   . Diabetes mellitus without complication (HCC)   . GERD (gastroesophageal reflux disease)   . Hypothyroidism   . PONV (postoperative nausea and vomiting)   . Thyroid disease     Assessment/Plan:   3 Days Post-Op Procedure(s) (LRB): TOTAL HIP ARTHROPLASTY ANTERIOR APPROACH (Left) Active Problems:   Primary localized osteoarthritis of left hip  Estimated body mass  index is 33.64 kg/m as calculated from the following:   Height as of this encounter: 5\' 7"  (1.702 m).   Weight as of this encounter: 97.4 kg (214 lb 12.8 oz). Advance diet Up with therapy  Needs BM before discharge Discharge to home with home health physical therapy today pending bowel movement. Patient educated on wound VAC, given honeycomb dressing to apply to left hip incision in 7-8 days postop once wound VAC therapy is completed. [facility].  DVT Prophylaxis - Lovenox, Foot Pumps and TED hose Weight-Bearing as tolerated to left leg   T. Cranston Neighborhris Gaines, PA-C [facility] 12/16/2015, 7:20 AM

## 2015-12-16 NOTE — Progress Notes (Signed)
Pt being discharged home today. PIV removed. Discharge instructions reviewed with pt, all questions answered. She will follow up with Dr Rosita Keamenz in 2 weeks time. Prescriptions were given to pt to have filled. She has HHPT set up, and has all needed equipment. She is leaving with all her belongings, will be transported home via family member.

## 2015-12-16 NOTE — Progress Notes (Signed)
Physical Therapy Treatment Patient Details Name: Suzanne Munoz MRN: [id number] DOB: October 06, 1965 Today's Date: 12/16/2015    History of Present Illness Pt. is a 50 y.o. female s/p Left THR.    PT Comments    Pt was able to ambulate well with walker and shows increased confidence and speed today, though she still has some mild limp with L WBing and tends to lean forward needing occasional cues for posture.  Pt did steps yesterday and did not wish to try again today as she felt confident.  Pt is still weak with hip flexion activities, but overall is doing well and looking forward to going home.   Follow Up Recommendations  Home health PT     Equipment Recommendations  Rolling walker with 5" wheels    Recommendations for Other Services       Precautions / Restrictions Precautions Precautions: Anterior Hip Restrictions LLE Weight Bearing: Weight bearing as tolerated    Mobility  Bed Mobility Overal bed mobility: Needs Assistance Bed Mobility: Sit to Supine       Sit to supine: Min assist   General bed mobility comments: Pt did need assist to get LE up into bed, she showed great effort and tried to lift L with R but ultimately could not lift it over the edge and needed light assist to get into bed  Transfers Overall transfer level: Modified independent Equipment used: Rolling walker (2 wheeled) Transfers: Sit to/from Stand Sit to Stand: Supervision         General transfer comment: Pt able to rise w/o assist, controlled descent back to bed w/o issue  Ambulation/Gait Ambulation/Gait assistance: Supervision Ambulation Distance (Feet): 350 Feet Assistive device: Rolling walker (2 wheeled)       General Gait Details: Pt did well with ambulation and was again able to do a majority of ambulation with consistent cadence and walker motion.  She does have some limp/favoring of the L hip and needs reminders to stand tall but generally safe and confident   Stairs            Wheelchair Mobility    Modified Rankin (Stroke Patients Only)       Balance Overall balance assessment: Modified Independent                                  Cognition Arousal/Alertness: Awake/alert Behavior During Therapy: WFL for tasks assessed/performed Overall Cognitive Status: Within Functional Limits for tasks assessed                      Exercises General Exercises - Lower Extremity Ankle Circles/Pumps: AROM;10 reps Quad Sets: 15 reps;Strengthening Gluteal Sets: 15 reps;Strengthening Long Arc Quad: AROM;10 reps Heel Slides: AROM;10 reps Hip ABduction/ADduction: 10 reps;Strengthening Hip Flexion/Marching: AAROM;10 reps;AROM (minimal AROM, but able to clear toes on numerous reps)    General Comments        Pertinent Vitals/Pain Pain Assessment: 0-10 Pain Score: 4     Home Living                      Prior Function            PT Goals (current goals can now be found in the care plan section) Progress towards PT goals: Progressing toward goals    Frequency    BID      PT Plan Current plan remains appropriate  Co-evaluation             End of Session Equipment Utilized During Treatment: Gait belt Activity Tolerance: Patient tolerated treatment well Patient left: with bed alarm set;with call bell/phone within reach;with family/visitor present     Time: [id number] PT Time Calculation (min) (ACUTE ONLY): 27 min  Charges:  $Gait Training: 8-22 mins $Therapeutic Exercise: 8-22 mins                    G Codes:      Malachi ProGalen R Maritssa Haughton, DPT  12/16/2015, 12:54 PM

## 2015-12-18 LAB — SURGICAL PATHOLOGY

## 2016-05-06 ENCOUNTER — Ambulatory Visit (INDEPENDENT_AMBULATORY_CARE_PROVIDER_SITE_OTHER): Payer: 59

## 2016-05-06 ENCOUNTER — Ambulatory Visit (INDEPENDENT_AMBULATORY_CARE_PROVIDER_SITE_OTHER): Payer: 59 | Admitting: Podiatry

## 2016-05-06 DIAGNOSIS — M779 Enthesopathy, unspecified: Secondary | ICD-10-CM

## 2016-05-06 DIAGNOSIS — L84 Corns and callosities: Secondary | ICD-10-CM

## 2016-05-06 DIAGNOSIS — M79672 Pain in left foot: Secondary | ICD-10-CM

## 2016-05-06 DIAGNOSIS — M205X2 Other deformities of toe(s) (acquired), left foot: Secondary | ICD-10-CM

## 2016-05-06 MED ORDER — TRIAMCINOLONE ACETONIDE 10 MG/ML IJ SUSP
10.0000 mg | Freq: Once | INTRAMUSCULAR | Status: AC
  Administered 2016-05-06: 10 mg

## 2016-05-21 NOTE — Progress Notes (Signed)
Subjective:     Patient ID: Suzanne Munoz, female   DOB: Oct 23, 1965, 51 y.o.   MRN: [id number]  HPI patient presents with several conditions with one being inflammation pain around the first MPJ and #2 being significant keratotic lesion fifth digit left over right foot   Review of Systems     Objective:   Physical Exam Neurovascular status intact muscle strength was normal with patient found to have inflamed first MPJ left with reduced range of motion with fluid buildup and also keratotic lesion formation outside fifth digit with pain and rotation of the toe    Assessment:     Inflammatory capsulitis first MPJ left with moderate hallux limitus rigidus deformity and also hammertoe deformity with keratotic lesion    Plan:     H&P and discussed with patient hallux limitus condition. At this point surgery may be indicated no I did discuss that with her but I went ahead today and I will try conservative care and I infiltrated around the joint surface 3 mg Kenalog 5 mg Xylocaine and debrided lesions and reappoint

## 2016-06-25 ENCOUNTER — Ambulatory Visit
Admission: RE | Admit: 2016-06-25 | Discharge: 2016-06-25 | Disposition: A | Discharge status: Home / Self Care | Payer: Worker's Compensation | Source: Ambulatory Visit | Attending: Family | Admitting: Family

## 2016-06-25 ENCOUNTER — Other Ambulatory Visit: Payer: Self-pay | Admitting: Family

## 2016-06-25 DIAGNOSIS — R609 Edema, unspecified: Secondary | ICD-10-CM

## 2016-06-25 DIAGNOSIS — R52 Pain, unspecified: Secondary | ICD-10-CM

## 2016-09-18 ENCOUNTER — Ambulatory Visit (INDEPENDENT_AMBULATORY_CARE_PROVIDER_SITE_OTHER): Payer: BLUE CROSS/BLUE SHIELD | Admitting: Podiatry

## 2016-09-18 ENCOUNTER — Ambulatory Visit (INDEPENDENT_AMBULATORY_CARE_PROVIDER_SITE_OTHER): Payer: BLUE CROSS/BLUE SHIELD

## 2016-09-18 ENCOUNTER — Encounter: Payer: Self-pay | Admitting: Podiatry

## 2016-09-18 ENCOUNTER — Other Ambulatory Visit: Payer: Self-pay | Admitting: Podiatry

## 2016-09-18 DIAGNOSIS — M722 Plantar fascial fibromatosis: Secondary | ICD-10-CM | POA: Diagnosis not present

## 2016-09-18 DIAGNOSIS — M79671 Pain in right foot: Secondary | ICD-10-CM

## 2016-09-18 DIAGNOSIS — M205X2 Other deformities of toe(s) (acquired), left foot: Secondary | ICD-10-CM

## 2016-09-18 MED ORDER — TRIAMCINOLONE ACETONIDE 10 MG/ML IJ SUSP
10.0000 mg | Freq: Once | INTRAMUSCULAR | Status: AC
  Administered 2016-09-18: 10 mg

## 2016-09-18 NOTE — Progress Notes (Signed)
Subjective:    Patient ID: Suzanne Munoz, female   DOB: 51 y.o.   MRN: [id number]   HPI patient presents with exquisite discomfort in the plantar aspect of the right heel and also has some discomfort in the big toe joint left over right from previous and a small blister on the fourth digit left with history of diabetes    ROS      Objective:  Physical Exam neurovascular status intact with inflammatory changes of the plantar right heel at the insertional point tendon into the calcaneus with inflammation of a mild nature on the first MPJ left and blister the fourth toe left     Assessment:    Acute plantar fasciitis right heel with inflammation fluid along with digital deformity fourth left and mild hallux limitus     Plan:     H&P and x-ray the right foot reviewed. At this point I have recommended a combination of conservative and aggressive acute therapy and I did inject the plantar fascia 3 Milligan Kenalog 5 g Xylocaine and applied fascial brace. I will then schedule with the head orthotist and we will make her a orthotic with a hallux limitus probable kinetic wedge type device to reduce the functional hallux limitus that's present. She is scheduled with him for evaluation and treatment  X-rays indicate that there is spur with no indication stress fracture and there is elevated first metatarsal segment

## 2016-09-22 ENCOUNTER — Ambulatory Visit (INDEPENDENT_AMBULATORY_CARE_PROVIDER_SITE_OTHER): Payer: BLUE CROSS/BLUE SHIELD | Admitting: Orthotics

## 2016-09-22 DIAGNOSIS — M205X2 Other deformities of toe(s) (acquired), left foot: Secondary | ICD-10-CM

## 2016-09-22 DIAGNOSIS — M722 Plantar fascial fibromatosis: Secondary | ICD-10-CM

## 2016-09-22 NOTE — Progress Notes (Signed)
Patient presents today for CMFO evaluation/assessment.  Patient presents with Plantar fasicitis R and Hallux limitus left.  [employer] to fab Sport orthotic semi rigid with spenco topcover..hallux limitus wedge bilateral.

## 2016-10-07 ENCOUNTER — Other Ambulatory Visit: Payer: BLUE CROSS/BLUE SHIELD | Admitting: Orthotics

## 2016-10-16 ENCOUNTER — Ambulatory Visit: Payer: BLUE CROSS/BLUE SHIELD | Admitting: Orthotics

## 2016-10-16 DIAGNOSIS — M79671 Pain in right foot: Secondary | ICD-10-CM

## 2016-10-16 NOTE — Progress Notes (Signed)
Patient came in today to pick up custom made foot orthotics.  The goals were accomplished and the patient reported no dissatisfaction with said orthotics.  Patient was advised of breakin period and how to report any issues. 

## 2016-10-24 ENCOUNTER — Other Ambulatory Visit: Payer: Self-pay | Admitting: Obstetrics and Gynecology

## 2016-10-24 DIAGNOSIS — Z1231 Encounter for screening mammogram for malignant neoplasm of breast: Secondary | ICD-10-CM

## 2016-10-27 ENCOUNTER — Ambulatory Visit
Admission: RE | Admit: 2016-10-27 | Discharge: 2016-10-27 | Disposition: A | Discharge status: Home / Self Care | Payer: BLUE CROSS/BLUE SHIELD | Source: Ambulatory Visit | Attending: Obstetrics and Gynecology | Admitting: Obstetrics and Gynecology

## 2016-10-27 DIAGNOSIS — Z1231 Encounter for screening mammogram for malignant neoplasm of breast: Secondary | ICD-10-CM

## 2016-10-31 ENCOUNTER — Telehealth: Payer: Self-pay | Admitting: Podiatry

## 2016-10-31 NOTE — Telephone Encounter (Signed)
Per voicemail from pt she was returning call from [place] 9.28.18 @ 1133am.  Pt states orthotics are working fine no issues and to call her if any questions.

## 2016-12-03 ENCOUNTER — Ambulatory Visit (INDEPENDENT_AMBULATORY_CARE_PROVIDER_SITE_OTHER): Payer: BLUE CROSS/BLUE SHIELD | Admitting: Podiatry

## 2016-12-03 DIAGNOSIS — M722 Plantar fascial fibromatosis: Secondary | ICD-10-CM | POA: Diagnosis not present

## 2016-12-03 MED ORDER — TRIAMCINOLONE ACETONIDE 10 MG/ML IJ SUSP
10.0000 mg | Freq: Once | INTRAMUSCULAR | Status: AC
  Administered 2016-12-03: 10 mg

## 2016-12-03 NOTE — Progress Notes (Signed)
Subjective:    Patient ID: Suzanne Munoz, female   DOB: 51 y.o.   MRN: [id number]   HPI patient states I'm still having a lot of pain in my right heel and I know end up having surgery on my left heel. Also it's worse when I get up in the morning and after sitting    ROS      Objective:  Physical Exam neurovascular status intact with exquisite discomfort plantar aspect right heel insertional point tendon calcaneus with pain that is quite extensive 1 palpated     Assessment:  Continued acute plantar fasciitis right that so far has not responded to conservative treatment      Plan:    At this time I reinjected the plantar fascia 3 mg Kenalog 5 mill grams Xylocaine and dispensed night splint with all instructions on usage

## 2017-04-02 ENCOUNTER — Ambulatory Visit: Payer: BLUE CROSS/BLUE SHIELD | Admitting: Podiatry

## 2017-04-02 ENCOUNTER — Encounter: Payer: Self-pay | Admitting: Podiatry

## 2017-04-02 ENCOUNTER — Ambulatory Visit (INDEPENDENT_AMBULATORY_CARE_PROVIDER_SITE_OTHER): Payer: BLUE CROSS/BLUE SHIELD

## 2017-04-02 DIAGNOSIS — M205X2 Other deformities of toe(s) (acquired), left foot: Secondary | ICD-10-CM

## 2017-04-02 DIAGNOSIS — M722 Plantar fascial fibromatosis: Secondary | ICD-10-CM | POA: Diagnosis not present

## 2017-04-02 DIAGNOSIS — M779 Enthesopathy, unspecified: Secondary | ICD-10-CM

## 2017-04-02 MED ORDER — TRIAMCINOLONE ACETONIDE 10 MG/ML IJ SUSP
10.0000 mg | Freq: Once | INTRAMUSCULAR | Status: AC
  Administered 2017-04-02: 10 mg

## 2017-04-02 NOTE — Patient Instructions (Signed)

## 2017-04-02 NOTE — Progress Notes (Signed)
Subjective:   Patient ID: Suzanne Munoz, female   DOB: 52 y.o.   MRN: [id number]   HPI Patient states the left big toe joint is really starting to bother her more and she states that she is getting minimal relief with injections and knows she needs surgery but may not be able to do it right away   ROS      Objective:  Physical Exam  Neurovascular status intact muscle strength adequate with discomfort in the left first MPJ with mild fluid around the joint and pain with palpation with moderate loss of motion and no current crepitus     Assessment:  Hallux limitus condition left foot plantar fasciitis right which is improved with inflammatory capsulitis left first MPJ     Plan:  H&P discussed all conditions reviewed x-ray left.  At this time I did inject around the joint 3 mg Kenalog pyelogram Xylocaine to try to give her short-term relief and discussed biplanar osteotomy and patient wants this done but has to discuss with work when would be the best time.  Will call to schedule and will be seen back prior to go over in greater detail  X-rays indicate there is moderate spur formation with no indications of stress fracture arthritis

## 2017-04-06 ENCOUNTER — Telehealth: Payer: Self-pay | Admitting: *Deleted

## 2017-04-06 NOTE — Telephone Encounter (Signed)
"  Dr. Charlsie Merlesegal told me to call you to schedule my surgery when I was ready.  He said his schedule is full but he could work me in if need be."  What date are you looking at for surgery?  "I have training on my job until March 30."  Dr. Charlsie Merlesegal can do your surgery on April 2.  "Oh that would be great!"  Have you signed consent forms?  "No, I haven't done anything yet."  Would you like me to transfer you to a scheduler?  "Sure, that would be great!"  The call was transferred to [place] in the call center.

## 2017-04-17 ENCOUNTER — Ambulatory Visit: Payer: BLUE CROSS/BLUE SHIELD | Admitting: Podiatry

## 2017-04-17 ENCOUNTER — Encounter: Payer: Self-pay | Admitting: Podiatry

## 2017-04-17 DIAGNOSIS — M205X2 Other deformities of toe(s) (acquired), left foot: Secondary | ICD-10-CM | POA: Diagnosis not present

## 2017-04-17 NOTE — Patient Instructions (Signed)
Pre-Operative Instructions  Congratulations, you have decided to take an important step towards improving your quality of life.  You can be assured that the doctors and staff at [facility] will be with you every step of the way.  Here are some important things you should know:  1. Plan to be at the surgery center/hospital at least 1 (one) hour prior to your scheduled time, unless otherwise directed by the surgical center/hospital staff.  You must have a responsible adult accompany you, remain during the surgery and drive you home.  Make sure you have directions to the surgical center/hospital to ensure you arrive on time. 2. If you are having surgery at [facility] or [facility], you will need a copy of your medical history and physical form from your family physician within one month prior to the date of surgery. We will give you a form for your primary physician to complete.  3. We make every effort to accommodate the date you request for surgery.  However, there are times where surgery dates or times have to be moved.  We will contact you as soon as possible if a change in schedule is required.   4. No aspirin/ibuprofen for one week before surgery.  If you are on aspirin, any non-steroidal anti-inflammatory medications (Mobic, Aleve, Ibuprofen) should not be taken seven (7) days prior to your surgery.  You make take Tylenol for pain prior to surgery.  5. Medications - If you are taking daily heart and blood pressure medications, seizure, reflux, allergy, asthma, anxiety, pain or diabetes medications, make sure you notify the surgery center/hospital before the day of surgery so they can tell you which medications you should take or avoid the day of surgery. 6. No food or drink after midnight the night before surgery unless directed otherwise by surgical center/hospital staff. 7. No alcoholic beverages 24-hours prior to surgery.  No smoking 24-hours prior or 24-hours after  surgery. 8. Wear loose pants or shorts. They should be loose enough to fit over bandages, boots, and casts. 9. Don't wear slip-on shoes. Sneakers are preferred. 10. Bring your boot with you to the surgery center/hospital.  Also bring crutches or a walker if your physician has prescribed it for you.  If you do not have this equipment, it will be provided for you after surgery. 11. If you have not been contacted by the surgery center/hospital by the day before your surgery, call to confirm the date and time of your surgery. 12. Leave-time from work may vary depending on the type of surgery you have.  Appropriate arrangements should be made prior to surgery with your employer. 13. Prescriptions will be provided immediately following surgery by your doctor.  Fill these as soon as possible after surgery and take the medication as directed. Pain medications will not be refilled on weekends and must be approved by the doctor. 14. Remove nail polish on the operative foot and avoid getting pedicures prior to surgery. 15. Wash the night before surgery.  The night before surgery wash the foot and leg well with water and the antibacterial soap provided. Be sure to pay special attention to beneath the toenails and in between the toes.  Wash for at least three (3) minutes. Rinse thoroughly with water and dry well with a towel.  Perform this wash unless told not to do so by your physician.  Enclosed: 1 Ice pack (please put in freezer the night before surgery)   1 Hibiclens skin cleaner     Pre-op instructions  If you have any questions regarding the instructions, please do not hesitate to call our office.  [place]: [address], [place], [place] [address] -- [phone]  [place]: [address] [place], [place] [address] -- [phone]  [place]: [address]  [place], [place] [address] -- [phone]  [place]: [address], [address], [place], [place] [address] -- [phone]  Website:  [email or web address] 

## 2017-04-20 NOTE — Progress Notes (Signed)
Subjective:   Patient ID: Suzanne Munoz, female   DOB: 52 y.o.   MRN: [id number]   HPI Patient presents with a painful hallux limitus deformity left that she wants corrected and she presents for consultation   ROS      Objective:  Physical Exam  Neurovascular status intact with inflammation and pain around the first MPJ left that is hard for her to wear shoe gear with reduced range of motion is noted with no crepitus of the joint     Assessment:  Hallux limitus condition left with structural changes and is beginning phase     Plan:  H&P condition reviewed and recommended a biplanar osteotomy with removal of bone spurs.  I do think we are catching this early which will be to her benefit long-term and hopefully prevent her from ever needing fusion or implant procedure.  I allowed her to read consent form going over alternative treatments complications and the fact there is no long-term guarantees and patient wants procedure signed consent form is given all preoperative instructions.  Patient understands total recovery will take 6 months to 1 year and she understands all this and signed consent form.  I then went ahead and I dispensed air fracture walker with all instructions on usage

## 2017-04-27 ENCOUNTER — Telehealth: Payer: Self-pay | Admitting: *Deleted

## 2017-04-27 NOTE — Telephone Encounter (Signed)
"  I have a surgery scheduled with Dr. Charlsie Merlesegal on Tuesday April 2.  I just want to clarify the information about wearing the boot.  I know I need to be out of work for a week but the week I go back to work, how long can I be up on my foot?  How long will I be up on my foot?  How long will I be in the boot as opposed to a surgical shoe?  I need to get this information to my supervisor today.  Give me a call back."

## 2017-04-28 NOTE — Telephone Encounter (Signed)
"  I have a few question regarding the boot.  How long will I be wearing the boot?"  It depends on how you are progressing.  If you are doing well after a week, Dr. Charlsie Merlesegal may transition you into a surgical shoe.  "Okay, he'll make that decision after at my post-op appointment?"  Yes, that's correct.  "I need to let my boss know.  I work at [employer].  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  You don't want to be up on it too much because it has the possibility of swelling.  Once it swells, it can become painful.  "I'll be able to sit when I need to at work.  I will not be able to lay down but I can prop it up in a booth if need be.  Thanks for your help."

## 2017-05-05 ENCOUNTER — Encounter: Payer: Self-pay | Admitting: Podiatry

## 2017-05-05 DIAGNOSIS — M2022 Hallux rigidus, left foot: Secondary | ICD-10-CM | POA: Diagnosis not present

## 2017-05-07 ENCOUNTER — Telehealth: Payer: Self-pay | Admitting: Podiatry

## 2017-05-07 MED ORDER — MEPERIDINE HCL 50 MG PO TABS: 50.0000 mg | ORAL_TABLET | ORAL | 0 refills | Status: DC | PRN

## 2017-05-07 NOTE — Telephone Encounter (Signed)
Dr. Charlsie Merlesegal ordered Demerol 50mg  #20 one tablet every 4 hours prn foot pain and continue to take the zofran for the nausea. I informed pt and she states she will not be able to have picked up because her husband works til past our closing. I told pt I would see if one of our doctors he could electronically send the medication.

## 2017-05-07 NOTE — Telephone Encounter (Signed)
Dr. Samuella CotaPrice sent Dr. Beverlee Nimsegal's orders for Demerol to the [facility]. I informed pt and also told her if she was able to take ibuprofen she could take it in between the doses of the demerol, and that our Dr. Samuella CotaPrice had sent the rx to the [facility]. Pt states understanding.

## 2017-05-07 NOTE — Addendum Note (Signed)
Addended by: Ventura SellersPRICE, Michie Molnar on: 05/07/2017 02:19 PM   Modules accepted: Orders

## 2017-05-07 NOTE — Telephone Encounter (Signed)
I had surgery on Tuesday with Dr. Charlsie Merlesegal. I was prescribed oxycodone and Zofran even though I told them that oxycodone makes me sick. I guess they thought by giving me the Zofran I would be okay. I got up at 4 am and took the medicine this morning. When I woke back up at 6 am I was really hot and sweaty and felt like I was going to either throw up, pass out or both so I have not taken anymore. I was calling to see if there is something else Dr. Charlsie Merlesegal could prescribe me for the pain?

## 2017-05-13 ENCOUNTER — Encounter: Payer: Self-pay | Admitting: Podiatry

## 2017-05-13 ENCOUNTER — Ambulatory Visit (INDEPENDENT_AMBULATORY_CARE_PROVIDER_SITE_OTHER): Payer: BLUE CROSS/BLUE SHIELD

## 2017-05-13 ENCOUNTER — Ambulatory Visit (INDEPENDENT_AMBULATORY_CARE_PROVIDER_SITE_OTHER): Payer: Self-pay | Admitting: Podiatry

## 2017-05-13 DIAGNOSIS — Z9889 Other specified postprocedural states: Secondary | ICD-10-CM

## 2017-05-13 DIAGNOSIS — M205X2 Other deformities of toe(s) (acquired), left foot: Secondary | ICD-10-CM

## 2017-05-18 NOTE — Progress Notes (Signed)
   Subjective:  Patient presents today status post bunionectomy left. DOS: 05/05/17. She states she is doing well and is experiencing minimal pain. She denies any new complaints at this time. Patient is here for further evaluation and treatment.    Past Medical History:  Diagnosis Date  . Anemia   . Diabetes mellitus without complication (HCC)   . GERD (gastroesophageal reflux disease)   . Hypothyroidism   . PONV (postoperative nausea and vomiting)   . Thyroid disease       Objective/Physical Exam Neurovascular status intact.  Skin incisions appear to be well coapted with sutures and staples intact. No sign of infectious process noted. No dehiscence. No active bleeding noted. Moderate edema noted to the surgical extremity.  Radiographic Exam:  Orthopedic hardware and osteotomies sites appear to be stable with routine healing.  Assessment: 1. s/p bunionectomy left. DOS: 05/05/17   Plan of Care:  1. Patient was evaluated. X-rays reviewed 2. Dressing changed. Keep clean, dry and intact for one week.  3. Continue weightbearing in CAM boot.  4. Note for work provided. No work for an additional week.  5. Return to clinic in one week.   Felecia ShellingBrent M. Evans, DPM [facility]  Dr. Felecia ShellingBrent M. Evans, DPM    [address]                                        [place], [place] [address]                Office [phone]  Fax [phone]

## 2017-05-20 ENCOUNTER — Encounter: Payer: Self-pay | Admitting: Podiatry

## 2017-05-20 ENCOUNTER — Ambulatory Visit (INDEPENDENT_AMBULATORY_CARE_PROVIDER_SITE_OTHER): Payer: BLUE CROSS/BLUE SHIELD | Admitting: Podiatry

## 2017-05-20 DIAGNOSIS — M205X2 Other deformities of toe(s) (acquired), left foot: Secondary | ICD-10-CM

## 2017-05-20 DIAGNOSIS — Z9889 Other specified postprocedural states: Secondary | ICD-10-CM

## 2017-05-20 NOTE — Progress Notes (Signed)
Subjective:   Patient ID: Suzanne Munoz, female   DOB: 52 y.o.   MRN: [id number]   HPI Patient presents stating overall doing pretty well but she still has discomfort in the big toe joint left and feels like the motion could be better   ROS      Objective:  Physical Exam  Neurovascular status intact with patient's left first MPJ functioning well with no crepitus of the joint noted wound edges well coapted and mild restriction of motion     Assessment:  Doing well overall with mild restriction in joint motion first MPJ     Plan:  Reviewed x-rays instructed on range of motion exercises and I am scheduling her for physical therapy to help increase the motion of the joint surface.  Patient is scheduled for physical therapy currently and at this time I am allowing her to gradually start wearing surgical shoe and hopefully tennis shoe in the next 2-4 weeks

## 2017-05-21 ENCOUNTER — Telehealth: Payer: Self-pay | Admitting: Podiatry

## 2017-05-21 NOTE — Telephone Encounter (Signed)
I saw Dr. Charlsie Merlesegal yesterday for my second post-op visit and he put me in a surgical shoe. I wore that yesterday. When I unwrapped my foot this morning to take a shower, there was a tiny spot of blood on the wrap, but more concerning is my foot is severely bruised to the point where it almost looks like its burned. It wasn't like that even after surgery. If somebody could give me a call back at [phone]. Thank you. Bye bye.

## 2017-05-21 NOTE — Telephone Encounter (Signed)
I spoke with Suzanne Munoz, informed she would have redness, bruised appearance and swelling to varying degrees for 6-9 months as the surgery foot is working to get back to normal sensation and day to day swelling. I told Suzanne Munoz not to sleep in the ace wrap, continue to sleep in the shoe due to the pins, and to remember hot shower water would bring blood to the foot and cause swelling and give the appearance of bruising, and to gradually add activities the foot would let her know when she had done too much by swelling, redness and discomfort. I told Suzanne Munoz I felt the little bit of blood was from the scab reconstituting or being peeled away, that if she had increased swelling, redness and a drainage from the incision she should contact our office. Suzanne Munoz states understanding.

## 2017-06-19 ENCOUNTER — Ambulatory Visit (INDEPENDENT_AMBULATORY_CARE_PROVIDER_SITE_OTHER): Payer: Self-pay | Admitting: Podiatry

## 2017-06-19 ENCOUNTER — Ambulatory Visit (INDEPENDENT_AMBULATORY_CARE_PROVIDER_SITE_OTHER): Payer: BLUE CROSS/BLUE SHIELD

## 2017-06-19 ENCOUNTER — Encounter: Payer: Self-pay | Admitting: Podiatry

## 2017-06-19 DIAGNOSIS — M205X2 Other deformities of toe(s) (acquired), left foot: Secondary | ICD-10-CM

## 2017-06-19 DIAGNOSIS — Z9889 Other specified postprocedural states: Secondary | ICD-10-CM

## 2017-06-21 NOTE — Progress Notes (Signed)
Subjective:   Patient ID: Suzanne Munoz, female   DOB: 52 y.o.   MRN: [id number]   HPI Patient states doing well overall and is still gets some discomfort in the ball but overall and very pleased so far with how it is doing   ROS      Objective:  Physical Exam  Neurovascular status intact negative Homans sign noted with wound edges well coapted hallux in rectus position good range of motion with no crepitus of the joint     Assessment:  Doing well post osteotomy first metatarsal left foot     Plan:  Advised patient on continued range of motion exercises no more physical therapy and continued activity levels.  Patient will be seen back to recheck as needed and was encouraged to call with any questions  X-ray indicates osteotomy is healing well fixation in place joint congruous and open

## 2017-10-27 ENCOUNTER — Other Ambulatory Visit: Payer: Self-pay | Admitting: Obstetrics and Gynecology

## 2017-10-27 DIAGNOSIS — Z1231 Encounter for screening mammogram for malignant neoplasm of breast: Secondary | ICD-10-CM

## 2017-11-04 ENCOUNTER — Ambulatory Visit
Admission: RE | Admit: 2017-11-04 | Discharge: 2017-11-04 | Disposition: A | Discharge status: Home / Self Care | Payer: BLUE CROSS/BLUE SHIELD | Source: Ambulatory Visit | Attending: Obstetrics and Gynecology | Admitting: Obstetrics and Gynecology

## 2017-11-04 DIAGNOSIS — Z1231 Encounter for screening mammogram for malignant neoplasm of breast: Secondary | ICD-10-CM

## 2018-08-04 ENCOUNTER — Encounter: Payer: Self-pay | Admitting: Podiatry

## 2018-08-04 ENCOUNTER — Other Ambulatory Visit: Payer: Self-pay

## 2018-08-04 ENCOUNTER — Ambulatory Visit: Payer: BC Managed Care – PPO | Admitting: Podiatry

## 2018-08-04 VITALS — Temp 98.8°F

## 2018-08-04 DIAGNOSIS — L84 Corns and callosities: Secondary | ICD-10-CM

## 2018-08-04 DIAGNOSIS — M2042 Other hammer toe(s) (acquired), left foot: Secondary | ICD-10-CM

## 2018-08-04 NOTE — Progress Notes (Signed)
Subjective:   Patient ID: Suzanne Munoz, female   DOB: 53 y.o.   MRN: [id number]   HPI Patient presents stating that she has several lesions on the left foot that are sore   ROS      Objective:  Physical Exam  Neurovascular status intact doing well with hallux limitus surgery right with good range of motion no crepitus with inflammation keratotic lesion fifth digit left and lateral fifth right     Assessment:  Digital hammertoe deformity bilateral along with well-healing hallux limitus repair left     Plan:  H&P condition reviewed and at this point I have recommended range of motion exercises and I debrided lesions going over digital deformity with possibility for surgical intervention that I educated her on today

## 2018-10-04 ENCOUNTER — Other Ambulatory Visit: Payer: Self-pay | Admitting: Internal Medicine

## 2018-10-04 DIAGNOSIS — Z1231 Encounter for screening mammogram for malignant neoplasm of breast: Secondary | ICD-10-CM

## 2018-11-08 ENCOUNTER — Ambulatory Visit
Admission: RE | Admit: 2018-11-08 | Discharge: 2018-11-08 | Disposition: A | Discharge status: Home / Self Care | Payer: BC Managed Care – PPO | Source: Ambulatory Visit | Attending: Internal Medicine | Admitting: Internal Medicine

## 2018-11-08 DIAGNOSIS — Z1231 Encounter for screening mammogram for malignant neoplasm of breast: Secondary | ICD-10-CM

## 2019-02-24 DIAGNOSIS — R200 Anesthesia of skin: Secondary | ICD-10-CM | POA: Insufficient documentation

## 2019-07-07 DIAGNOSIS — R208 Other disturbances of skin sensation: Secondary | ICD-10-CM | POA: Insufficient documentation

## 2019-07-07 DIAGNOSIS — G8929 Other chronic pain: Secondary | ICD-10-CM | POA: Insufficient documentation

## 2019-07-07 DIAGNOSIS — G479 Sleep disorder, unspecified: Secondary | ICD-10-CM | POA: Insufficient documentation

## 2019-07-11 ENCOUNTER — Ambulatory Visit: Payer: BC Managed Care – PPO | Admitting: Dermatology

## 2019-07-11 ENCOUNTER — Other Ambulatory Visit: Payer: Self-pay

## 2019-07-11 DIAGNOSIS — I8391 Asymptomatic varicose veins of right lower extremity: Secondary | ICD-10-CM

## 2019-07-11 DIAGNOSIS — L821 Other seborrheic keratosis: Secondary | ICD-10-CM

## 2019-07-11 DIAGNOSIS — I781 Nevus, non-neoplastic: Secondary | ICD-10-CM

## 2019-07-11 NOTE — Patient Instructions (Signed)
BEFORE YOUR APPOINTMENT FOR SCLEROTHERAPY  1. When you telephone for your appointment for the sclerotherapy procedure, please let the receptionist know that you are scheduling for the fifteen (15) minute sclerotherapy procedure not just a regular visit.  2. On the day of the procedure, please cleanse and dry the areas, but do not use any moisturizers or other products on the area(s) to be treated.  3. Bring a pair of comfortable shorts to wear during the procedure.  4. Be sure to bring your recommended graduated compression stockings with you to the office. You will be wearing them home when your visit is over. These compression hose can be purchased at most medical supply stores.  After Your Sclerotherapy Procedure  1. Please wear the graduated compression stockings for 24 hours immediately following the completion of the sclerotherapy procedure.  2. We recommend that you avoid vigorous activity as much as possible for the first twenty-four (24) hours. You can do your "normal" routine, but avoid an above normal amount of time on your feet. Elevating the legs when sitting and avoidance of vigorous leg movements or exercise in the first few days after treatment may improve your results.  3. You may remove the compression dressings (cotton balls) and tape the next morning.  4. Please continue wearing the compression stockings during waking hours for the two (2) weeks following sclerotherapy.  5. If you have any blisters, sores or ulcers or other problems following your procedure please call or return to the office immediately.     THE PROCEDURE FEE IS $350.00 PER FIFTEEN (15) MINUTE SESSION. WE REQUIRE THAT THIS PROCEDURE BE PAID FOR IN FULL ON OR BEFORE THE DATE THAT IT IS PERFORMED. WE WILL GIVE YOU A RECEIPT THAT YOU CAN FILE WITH YOUR INSURANCE COMPANY. WE GENERALLY DO NOT FILE THIS PROCEDURE WITH ANY INSURANCE COMPANY EXCEPT UNDER CERTAIN CIRCUMSTANCES WHERE PRIOR AUTHORIZATION HAS BEEN  CONFIRMED. THIS PROCEDURE IS GENERALLY CONSIDERED TO BE A COSMETIC PROCEDURE BY INSURANCE COMPANIES. 

## 2019-07-11 NOTE — Progress Notes (Signed)
   Follow-Up Visit   Subjective  Suzanne Munoz is a 54 y.o. female who presents for the following: Sclero consult. She had sclerotherapy in the past, around 30 years ago.  The following portions of the chart were reviewed this encounter and updated as appropriate:      Review of Systems:  No other skin or systemic complaints except as noted in HPI or Assessment and Plan.  Objective  Well appearing patient in no apparent distress; mood and affect are within normal limits.  A focused examination was performed including legs. Relevant physical exam findings are noted in the Assessment and Plan.  Objective  Right thigh, L lower pretibia: Waxy tan macules.  Objective  Right Calf, pretibia bilateral, lateral thighs: Telangiectasias  Objective  Right Lower Leg: Varicose veins.   Assessment & Plan    Seborrheic keratosis Right thigh, L lower pretibia  Discussed cosmetic LN2 treatment, $60 for the first lesion and $15 for each additional if treated on the same day. Discussed risk of hyperpigmentation. Recommend treating during fall/winter.  Telangiectasias Right Calf, pretibia bilateral, lateral thighs  Discussed Sclerotherapy injections. Patient may need more than one treatment to get desires results. Discussed bruising and potential hyperpigmentation Recommend compression socks daily.  Varicose veins of right lower extremity, unspecified whether complicated Right Lower Leg  If patient desires treatment of varicose veins, recommend patient visit [facility] for consult. Recommend compression socks daily.  Return for sclero. Patient will schedule this fall.Wendee Beavers, CMA, am acting as scribe for Willeen Niece, MD .  Documentation: I have reviewed the above documentation for accuracy and completeness, and I agree with the above.  Willeen Niece MD

## 2019-07-20 ENCOUNTER — Ambulatory Visit: Payer: BC Managed Care – PPO | Admitting: Dermatology

## 2019-10-21 ENCOUNTER — Other Ambulatory Visit: Payer: Self-pay | Admitting: Internal Medicine

## 2019-10-21 DIAGNOSIS — Z1231 Encounter for screening mammogram for malignant neoplasm of breast: Secondary | ICD-10-CM

## 2019-11-09 ENCOUNTER — Other Ambulatory Visit: Payer: Self-pay

## 2019-11-09 ENCOUNTER — Ambulatory Visit
Admission: RE | Admit: 2019-11-09 | Discharge: 2019-11-09 | Disposition: A | Discharge status: Home / Self Care | Payer: BC Managed Care – PPO | Source: Ambulatory Visit | Attending: Internal Medicine | Admitting: Internal Medicine

## 2019-11-09 DIAGNOSIS — Z1231 Encounter for screening mammogram for malignant neoplasm of breast: Secondary | ICD-10-CM | POA: Diagnosis not present

## 2019-11-21 ENCOUNTER — Ambulatory Visit: Payer: BC Managed Care – PPO | Admitting: Dermatology

## 2020-03-21 ENCOUNTER — Other Ambulatory Visit: Payer: Self-pay | Admitting: Student

## 2020-03-21 DIAGNOSIS — M7521 Bicipital tendinitis, right shoulder: Secondary | ICD-10-CM

## 2020-03-21 DIAGNOSIS — M7581 Other shoulder lesions, right shoulder: Secondary | ICD-10-CM

## 2020-03-31 ENCOUNTER — Ambulatory Visit
Admission: RE | Admit: 2020-03-31 | Discharge: 2020-03-31 | Disposition: A | Discharge status: Home / Self Care | Payer: BC Managed Care – PPO | Source: Ambulatory Visit | Attending: Student | Admitting: Student

## 2020-03-31 ENCOUNTER — Other Ambulatory Visit: Payer: Self-pay

## 2020-03-31 DIAGNOSIS — M7521 Bicipital tendinitis, right shoulder: Secondary | ICD-10-CM | POA: Diagnosis present

## 2020-03-31 DIAGNOSIS — M7581 Other shoulder lesions, right shoulder: Secondary | ICD-10-CM | POA: Insufficient documentation

## 2020-04-24 ENCOUNTER — Other Ambulatory Visit: Payer: Self-pay | Admitting: Surgery

## 2020-05-01 ENCOUNTER — Other Ambulatory Visit: Payer: Self-pay

## 2020-05-01 ENCOUNTER — Other Ambulatory Visit
Admission: RE | Admit: 2020-05-01 | Discharge: 2020-05-01 | Disposition: A | Discharge status: Home / Self Care | Payer: BC Managed Care – PPO | Source: Ambulatory Visit | Attending: Surgery | Admitting: Surgery

## 2020-05-01 DIAGNOSIS — Z01818 Encounter for other preprocedural examination: Secondary | ICD-10-CM | POA: Diagnosis present

## 2020-05-01 NOTE — Progress Notes (Signed)
Perioperative Services Pre-Admission/Anesthesia Testing   Date: 05/01/20 Name: Suzanne Munoz MRN:   [id number]  Re: Consideration of preoperative prophylactic antibiotic change   Request sent to: Poggi, Excell Seltzer, MD (routed and/or faxed via [facility])  Planned Surgical Procedure(s):    Case: [id number] Date/Time: 05/08/20 1309   Procedure: SHOULDER ARTHROSCOPY WITH DEBRIDEMENT, DECOMPRESSION, DISTAL CLAVICLE EXCISION, ROTATOR CUFF REPAIR AND POSSIBLE BICEP TENODESIS. (Right Shoulder)   Anesthesia type: Choice   Pre-op diagnosis:      Subacromial impingement of right shoulder M75.41     Right rotator cuff tendonitis M75.81     Nontraumatic complete tear of right rotator cuff M75.121   Location: [facility] OR ROOM 03 / [facility]   Surgeons: Christena Flake, MD    Notes: 1. Patient has a documented allergy to amoxicillin only . Advising that amoxicillin has caused her to experience low severity pruritis in the past.   2. Received cephalosporin with no documented complications . CEFAZOLIN received on 12/13/2015  3. Screened as appropriate for cephalosporin use during medication reconciliation . No immediate angioedema, dysphagia, SOB, anaphylaxis symptoms. . No severe rash involving mucous membranes or skin necrosis. . No hospital admissions related to side effects of PCN/cephalosporin use.  . No documented reaction to PCN or cephalosporin in the last 10 years.  Request:  As an evidence based approach to reducing the rate of incidence for post-operative SSI and the development of MDROs, could an agent with narrower coverage for preoperative prophylaxis in this patient's upcoming surgical course be considered?   1. Currently ordered preoperative prophylactic ABX: clindamycin.   2. Specifically requesting change to cephalosporin (CEFAZOLIN).   3. Please communicate decision with me and I will change the orders in Epic as per your direction.   Things to consider:  Many  patients report that they were "allergic" to PCN earlier in life, however this does not translate into a true lifelong allergy. Patients can lose sensitivity to specific IgE antibodies over time if PCN is avoided (Kleris & Lugar, 2019).   Up to 10% of the adult population and 15% of hospitalized patients report an allergy to PCN, however clinical studies suggest that 90% of those reporting an allergy can tolerate PCN antibiotics (Kleris & Lugar, 2019).   Cross-sensitivity between PCN and cephalosporins has been documented as being as high as 10%, however this estimation included data believed to have been collected in a setting where there was contamination. Newer data suggests that the prevalence of cross-sensitivity between PCN and cephalosporins is actually estimated to be closer to 1% (Hermanides et al., 2018).    Patients labeled as PCN allergic, whether they are truly allergic or not, have been found to have inferior outcomes in terms of rates of serious infection, and these patients tend to have longer hospital stays [facility] & Lugar, 2019).   Treatment related secondary infections, such as Clostridioides difficile, have been linked to the improper use of broad spectrum antibiotics in patients improperly labeled as PCN allergic (Kleris & Lugar, 2019).   Anaphylaxis from cephalosporins is rare and the evidence suggests that there is no increased risk of an anaphylactic type reaction when cephalosporins are used in a PCN allergic patient (Pichichero, 2006).  Citations: Hermanides J, Lemkes BA, Prins Gwenyth Bender MW, Terreehorst I. Presumed ?-Lactam Allergy and Cross-reactivity in the Operating Theater: A Practical Approach. Anesthesiology. 2018 Aug;129(2):335-342. doi: 10.1097/ALN.0000000000002252. PMID: 83382505.  Kleris, R. S., & Lugar, P. L. (2019). Things We Do For No Reason: Failing  to Question a Penicillin Allergy History. Journal of hospital medicine, 14(10), [phone]. Advance online  publication. airportbarriers.com  Pichichero, M. E. (2006). Cephalosporins can be prescribed safely for penicillin-allergic patients. Journal of family medicine, 55(2), 106-112. Accessed: https://cdn.mdedge.com/files/s32fs-public/Document/September-2017/5502JFP_AppliedEvidence1.pdf   Quentin Mulling, MSN, APRN, FNP-C, CEN [facility]  Peri-operative Services Nurse Practitioner FAX: [phone] 05/01/20 2:26 PM

## 2020-05-01 NOTE — Patient Instructions (Addendum)
Your procedure is scheduled on: 05/08/20- Tuesday Report to the Registration Desk on the 1st floor of the [facility]. To find out your arrival time, please call [phone] between 1PM - 3PM on: 05/07/20 - Monday Report to [facility] on 05/04/20 at for Covid Test and EKG -   REMEMBER: Instructions that are not followed completely may result in serious medical risk, up to and including death; or upon the discretion of your surgeon and anesthesiologist your surgery may need to be rescheduled.  Do not eat food after midnight the night before surgery.  No gum chewing, lozengers or hard candies.  You may however, drink CLEAR liquids up to 2 hours before you are scheduled to arrive for your surgery. Do not drink anything within 2 hours of your scheduled arrival time. Type 1 and Type 2 diabetics should only drink water.  TAKE THESE MEDICATIONS THE MORNING OF SURGERY WITH A SIP OF WATER: -  ALLEGRA PO - SYNTHROID 137 MCG  - pantoprazole (PROTONIX) 40 MG tablet, take one the night before and one on the morning of surgery - helps to prevent nausea after surgery  Stop Metformin 2 days prior to surgery. Do not take 04/03, 04/04, and do not take the morning of surgery.  One week prior to surgery: Stop Anti-inflammatories (NSAIDS) such as Advil, Aleve, Ibuprofen, Motrin, Naproxen, Naprosyn and Aspirin based products such as Excedrin, Goodys Powder, BC Powder.  Stop ANY OVER THE COUNTER supplements until after surgery.  No Alcohol for 24 hours before or after surgery.  No Smoking including e-cigarettes for 24 hours prior to surgery.  No chewable tobacco products for at least 6 hours prior to surgery.  No nicotine patches on the day of surgery.  Do not use any "recreational" drugs for at least a week prior to your surgery.  Please be advised that the combination of cocaine and anesthesia may have negative outcomes, up to and including death. If you test positive for cocaine, your surgery will  be cancelled.  On the morning of surgery brush your teeth with toothpaste and water, you may rinse your mouth with mouthwash if you wish. Do not swallow any toothpaste or mouthwash.  Do not wear jewelry, make-up, hairpins, clips or nail polish.  Do not wear lotions, powders, or perfumes.   Do not shave body from the neck down 48 hours prior to surgery just in case you cut yourself which could leave a site for infection.  Also, freshly shaved skin may become irritated if using the CHG soap.  Contact lenses, hearing aids and dentures may not be worn into surgery.  Do not bring valuables to the hospital. [facility] is not responsible for any missing/lost belongings or valuables.   Use CHG Soap or wipes as directed on instruction sheet.  Total Shoulder Arthroplasty:  use Benzolyl Peroxide 5% Gel as directed on instruction sheet.  Notify your doctor if there is any change in your medical condition (cold, fever, infection).  Wear comfortable clothing (specific to your surgery type) to the hospital.  Plan for stool softeners for home use; pain medications have a tendency to cause constipation. You can also help prevent constipation by eating foods high in fiber such as fruits and vegetables and drinking plenty of fluids as your diet allows.  After surgery, you can help prevent lung complications by doing breathing exercises.  Take deep breaths and cough every 1-2 hours. Your doctor may order a device called an Incentive Spirometer to help you take  deep breaths. When coughing or sneezing, hold a pillow firmly against your incision with both hands. This is called "splinting." Doing this helps protect your incision. It also decreases belly discomfort.  If you are being admitted to the hospital overnight, leave your suitcase in the car. After surgery it may be brought to your room.  If you are being discharged the day of surgery, you will not be allowed to drive home. You will need a  responsible adult (18 years or older) to drive you home and stay with you that night.   If you are taking public transportation, you will need to have a responsible adult (18 years or older) with you. Please confirm with your physician that it is acceptable to use public transportation.   Please call the Pre-admissions Testing Dept. at [phone] if you have any questions about these instructions.  Surgery Visitation Policy:  Patients undergoing a surgery or procedure may have one family member or support person with them as long as that person is not COVID-19 positive or experiencing its symptoms.  That person may remain in the waiting area during the procedure.  Inpatient Visitation:    Visiting hours are 7 a.m. to 8 p.m. Inpatients will be allowed two visitors daily. The visitors may change each day during the patient's stay. No visitors under the age of 87. Any visitor under the age of 55 must be accompanied by an adult. The visitor must pass COVID-19 screenings, use hand sanitizer when entering and exiting the patient's room and wear a mask at all times, including in the patient's room. Patients must also wear a mask when staff or their visitor are in the room. Masking is required regardless of vaccination status.

## 2020-05-02 NOTE — Progress Notes (Signed)
  Perioperative Services Pre-Admission/Anesthesia Testing     Date: 05/02/20  Name: Suzanne Munoz MRN:   [id number]  Re: Change in ABX for upcoming surgery   Case: [id number] Date/Time: 05/08/20 1309   Procedure: SHOULDER ARTHROSCOPY WITH DEBRIDEMENT, DECOMPRESSION, DISTAL CLAVICLE EXCISION, ROTATOR CUFF REPAIR AND POSSIBLE BICEP TENODESIS. (Right Shoulder)   Anesthesia type: Choice   Pre-op diagnosis:      Subacromial impingement of right shoulder M75.41     Right rotator cuff tendonitis M75.81     Nontraumatic complete tear of right rotator cuff M75.121   Location: [facility] OR ROOM 03 / [facility] ORS FOR ANESTHESIA GROUP   Surgeons: Christena Flake, MD    Primary attending surgeon was consulted regarding consideration of therapeutic change in antimicrobial agent being used for preoperative prophylaxis in this patient's upcoming surgical case. Following analysis of the risk versus benefits, Dr. Joice Lofts, Excell Seltzer, MD advising that it would be acceptable to discontinue the ordered clindamycin and place an order for cefazolin 2 gm IV on call to the OR. Orders for this patient were amended by me following collaborative conversation with attending surgeon.  Quentin Mulling, MSN, APRN, FNP-C, CEN [facility]  Peri-operative Services Nurse Practitioner Phone: [phone] 05/02/20 9:48 AM

## 2020-05-04 ENCOUNTER — Other Ambulatory Visit
Admission: RE | Admit: 2020-05-04 | Discharge: 2020-05-04 | Disposition: A | Discharge status: Home / Self Care | Payer: BC Managed Care – PPO | Source: Ambulatory Visit | Attending: Surgery | Admitting: Surgery

## 2020-05-04 ENCOUNTER — Other Ambulatory Visit: Payer: Self-pay

## 2020-05-04 DIAGNOSIS — Z20822 Contact with and (suspected) exposure to covid-19: Secondary | ICD-10-CM | POA: Diagnosis not present

## 2020-05-04 DIAGNOSIS — Z01818 Encounter for other preprocedural examination: Secondary | ICD-10-CM | POA: Diagnosis present

## 2020-05-04 LAB — SARS CORONAVIRUS 2 (TAT 6-24 HRS): SARS Coronavirus 2: NEGATIVE

## 2020-05-08 ENCOUNTER — Other Ambulatory Visit: Payer: Self-pay

## 2020-05-08 ENCOUNTER — Ambulatory Visit: Payer: BC Managed Care – PPO

## 2020-05-08 ENCOUNTER — Ambulatory Visit: Payer: BC Managed Care – PPO | Admitting: Urgent Care

## 2020-05-08 ENCOUNTER — Encounter: Payer: Self-pay | Admitting: Surgery

## 2020-05-08 ENCOUNTER — Ambulatory Visit
Admission: RE | Admit: 2020-05-08 | Discharge: 2020-05-08 | Disposition: A | Discharge status: Home / Self Care | Payer: BC Managed Care – PPO | Attending: Surgery | Admitting: Surgery

## 2020-05-08 ENCOUNTER — Encounter
Admission: RE | Disposition: A | Discharge status: Home / Self Care | Payer: Self-pay | Source: Home / Self Care | Attending: Surgery

## 2020-05-08 DIAGNOSIS — Z79899 Other long term (current) drug therapy: Secondary | ICD-10-CM | POA: Insufficient documentation

## 2020-05-08 DIAGNOSIS — Z885 Allergy status to narcotic agent status: Secondary | ICD-10-CM | POA: Diagnosis not present

## 2020-05-08 DIAGNOSIS — M75121 Complete rotator cuff tear or rupture of right shoulder, not specified as traumatic: Secondary | ICD-10-CM | POA: Diagnosis not present

## 2020-05-08 DIAGNOSIS — Z794 Long term (current) use of insulin: Secondary | ICD-10-CM | POA: Insufficient documentation

## 2020-05-08 DIAGNOSIS — Z419 Encounter for procedure for purposes other than remedying health state, unspecified: Secondary | ICD-10-CM

## 2020-05-08 DIAGNOSIS — M7521 Bicipital tendinitis, right shoulder: Secondary | ICD-10-CM | POA: Diagnosis not present

## 2020-05-08 DIAGNOSIS — Z880 Allergy status to penicillin: Secondary | ICD-10-CM | POA: Diagnosis not present

## 2020-05-08 DIAGNOSIS — M19011 Primary osteoarthritis, right shoulder: Secondary | ICD-10-CM | POA: Diagnosis present

## 2020-05-08 DIAGNOSIS — E119 Type 2 diabetes mellitus without complications: Secondary | ICD-10-CM | POA: Insufficient documentation

## 2020-05-08 HISTORY — PX: SHOULDER ARTHROSCOPY WITH SUBACROMIAL DECOMPRESSION, ROTATOR CUFF REPAIR AND BICEP TENDON REPAIR: SHX5687

## 2020-05-08 LAB — POCT I-STAT, CHEM 8
BUN: 14 mg/dL (ref 6–20)
Calcium, Ion: 1.19 mmol/L (ref 1.15–1.40)
Chloride: 104 mmol/L (ref 98–111)
Creatinine, Ser: 0.8 mg/dL (ref 0.44–1.00)
Glucose, Bld: 170 mg/dL — ABNORMAL HIGH (ref 70–99)
HCT: 40 % (ref 36.0–46.0)
Hemoglobin: 13.6 g/dL (ref 12.0–15.0)
Potassium: 3.9 mmol/L (ref 3.5–5.1)
Sodium: 140 mmol/L (ref 135–145)
TCO2: 24 mmol/L (ref 22–32)

## 2020-05-08 LAB — GLUCOSE, CAPILLARY: Glucose-Capillary: 250 mg/dL — ABNORMAL HIGH (ref 70–99)

## 2020-05-08 LAB — POCT PREGNANCY, URINE: Preg Test, Ur: NEGATIVE

## 2020-05-08 SURGERY — SHOULDER ARTHROSCOPY WITH SUBACROMIAL DECOMPRESSION, ROTATOR CUFF REPAIR AND BICEP TENDON REPAIR
Anesthesia: General | Site: Shoulder | Laterality: Right

## 2020-05-08 MED ORDER — ORAL CARE MOUTH RINSE: 15.0000 mL | Freq: Once | OROMUCOSAL | Status: AC

## 2020-05-08 MED ORDER — DIPHENHYDRAMINE HCL 50 MG/ML IJ SOLN
INTRAMUSCULAR | Status: DC | PRN
  Administered 2020-05-08: 12.5 mg via INTRAVENOUS

## 2020-05-08 MED ORDER — LIDOCAINE HCL (PF) 1 % IJ SOLN
INTRAMUSCULAR | Status: DC | PRN
  Administered 2020-05-08: 3 mL

## 2020-05-08 MED ORDER — PROPOFOL 1000 MG/100ML IV EMUL
INTRAVENOUS | Status: AC
  Filled 2020-05-08: qty 100

## 2020-05-08 MED ORDER — ONDANSETRON HCL 4 MG/2ML IJ SOLN
INTRAMUSCULAR | Status: AC
  Filled 2020-05-08: qty 2

## 2020-05-08 MED ORDER — BUPIVACAINE LIPOSOME 1.3 % IJ SUSP
INTRAMUSCULAR | Status: AC
  Filled 2020-05-08: qty 20

## 2020-05-08 MED ORDER — METOCLOPRAMIDE HCL 10 MG PO TABS: 5.0000 mg | ORAL_TABLET | Freq: Three times a day (TID) | ORAL | Status: DC | PRN

## 2020-05-08 MED ORDER — CHLORHEXIDINE GLUCONATE 0.12 % MT SOLN
15.0000 mL | Freq: Once | OROMUCOSAL | Status: AC
  Administered 2020-05-08: 15 mL via OROMUCOSAL

## 2020-05-08 MED ORDER — PHENYLEPHRINE HCL-NACL 10-0.9 MG/250ML-% IV SOLN
INTRAVENOUS | Status: DC | PRN
  Administered 2020-05-08: 20 ug/min via INTRAVENOUS

## 2020-05-08 MED ORDER — CEFAZOLIN SODIUM-DEXTROSE 2-4 GM/100ML-% IV SOLN
2.0000 g | Freq: Once | INTRAVENOUS | Status: AC
  Administered 2020-05-08: 2 g via INTRAVENOUS

## 2020-05-08 MED ORDER — KETAMINE HCL 10 MG/ML IJ SOLN
INTRAMUSCULAR | Status: DC | PRN
  Administered 2020-05-08 (×2): 25 mg via INTRAVENOUS

## 2020-05-08 MED ORDER — LIDOCAINE HCL (PF) 1 % IJ SOLN
INTRAMUSCULAR | Status: AC
  Filled 2020-05-08: qty 5

## 2020-05-08 MED ORDER — ONDANSETRON 4 MG PO TBDP: 4.0000 mg | ORAL_TABLET | Freq: Three times a day (TID) | ORAL | 1 refills | Status: AC | PRN

## 2020-05-08 MED ORDER — EPINEPHRINE PF 1 MG/ML IJ SOLN
INTRAMUSCULAR | Status: AC
  Filled 2020-05-08: qty 1

## 2020-05-08 MED ORDER — LACTATED RINGERS IV SOLN
INTRAVENOUS | Status: DC | PRN
  Administered 2020-05-08: 4000 mL

## 2020-05-08 MED ORDER — ONDANSETRON HCL 4 MG/2ML IJ SOLN: 4.0000 mg | Freq: Four times a day (QID) | INTRAMUSCULAR | Status: DC | PRN

## 2020-05-08 MED ORDER — LIDOCAINE HCL (CARDIAC) PF 100 MG/5ML IV SOSY
PREFILLED_SYRINGE | INTRAVENOUS | Status: DC | PRN
  Administered 2020-05-08: 80 mg via INTRAVENOUS

## 2020-05-08 MED ORDER — DEXMEDETOMIDINE (PRECEDEX) IN NS 20 MCG/5ML (4 MCG/ML) IV SYRINGE
PREFILLED_SYRINGE | INTRAVENOUS | Status: AC
  Filled 2020-05-08: qty 10

## 2020-05-08 MED ORDER — PROPOFOL 500 MG/50ML IV EMUL
INTRAVENOUS | Status: DC | PRN
  Administered 2020-05-08: 125 ug/kg/min via INTRAVENOUS
  Administered 2020-05-08: 150 ug/kg/min via INTRAVENOUS

## 2020-05-08 MED ORDER — METOCLOPRAMIDE HCL 5 MG/ML IJ SOLN: 5.0000 mg | Freq: Three times a day (TID) | INTRAMUSCULAR | Status: DC | PRN

## 2020-05-08 MED ORDER — POTASSIUM CHLORIDE IN NACL 20-0.9 MEQ/L-% IV SOLN
INTRAVENOUS | Status: DC
  Filled 2020-05-08 (×5): qty 1000

## 2020-05-08 MED ORDER — LACTATED RINGERS IV SOLN: INTRAVENOUS | Status: DC | PRN

## 2020-05-08 MED ORDER — LIDOCAINE HCL (PF) 2 % IJ SOLN
INTRAMUSCULAR | Status: AC
  Filled 2020-05-08: qty 10

## 2020-05-08 MED ORDER — FENTANYL CITRATE (PF) 100 MCG/2ML IJ SOLN
INTRAMUSCULAR | Status: DC | PRN
  Administered 2020-05-08: 50 ug via INTRAVENOUS

## 2020-05-08 MED ORDER — DEXMEDETOMIDINE (PRECEDEX) IN NS 20 MCG/5ML (4 MCG/ML) IV SYRINGE
PREFILLED_SYRINGE | INTRAVENOUS | Status: DC | PRN
  Administered 2020-05-08: 40 ug via INTRAVENOUS

## 2020-05-08 MED ORDER — PROPOFOL 10 MG/ML IV BOLUS
INTRAVENOUS | Status: DC | PRN
  Administered 2020-05-08: 180 mg via INTRAVENOUS
  Administered 2020-05-08: 60 mg via INTRAVENOUS
  Administered 2020-05-08: 50 mg via INTRAVENOUS

## 2020-05-08 MED ORDER — ROCURONIUM BROMIDE 10 MG/ML (PF) SYRINGE
PREFILLED_SYRINGE | INTRAVENOUS | Status: AC
  Filled 2020-05-08: qty 10

## 2020-05-08 MED ORDER — ACETAMINOPHEN 10 MG/ML IV SOLN
INTRAVENOUS | Status: DC | PRN
  Administered 2020-05-08: 1000 mg via INTRAVENOUS

## 2020-05-08 MED ORDER — FENTANYL CITRATE (PF) 100 MCG/2ML IJ SOLN
25.0000 ug | INTRAMUSCULAR | Status: DC | PRN
Start: 2020-05-08 — End: 2020-05-08

## 2020-05-08 MED ORDER — SUCCINYLCHOLINE CHLORIDE 20 MG/ML IJ SOLN
INTRAMUSCULAR | Status: DC | PRN
  Administered 2020-05-08: 140 mg via INTRAVENOUS

## 2020-05-08 MED ORDER — PROPOFOL 10 MG/ML IV BOLUS
INTRAVENOUS | Status: AC
  Filled 2020-05-08: qty 20

## 2020-05-08 MED ORDER — BUPIVACAINE LIPOSOME 1.3 % IJ SUSP
INTRAMUSCULAR | Status: DC | PRN
  Administered 2020-05-08: 20 mL via PERINEURAL

## 2020-05-08 MED ORDER — MIDAZOLAM HCL 2 MG/2ML IJ SOLN: 1.0000 mg | Freq: Once | INTRAMUSCULAR | Status: AC

## 2020-05-08 MED ORDER — SCOPOLAMINE 1 MG/3DAYS TD PT72: 1.0000 | MEDICATED_PATCH | TRANSDERMAL | Status: DC

## 2020-05-08 MED ORDER — DEXAMETHASONE SODIUM PHOSPHATE 10 MG/ML IJ SOLN
INTRAMUSCULAR | Status: DC | PRN
  Administered 2020-05-08: 10 mg via INTRAVENOUS

## 2020-05-08 MED ORDER — MIDAZOLAM HCL 2 MG/2ML IJ SOLN
INTRAMUSCULAR | Status: AC
  Administered 2020-05-08: 1 mg via INTRAVENOUS
  Filled 2020-05-08: qty 2

## 2020-05-08 MED ORDER — CEFAZOLIN SODIUM-DEXTROSE 2-4 GM/100ML-% IV SOLN
INTRAVENOUS | Status: AC
  Filled 2020-05-08: qty 100

## 2020-05-08 MED ORDER — SUCCINYLCHOLINE CHLORIDE 200 MG/10ML IV SOSY
PREFILLED_SYRINGE | INTRAVENOUS | Status: AC
  Filled 2020-05-08: qty 10

## 2020-05-08 MED ORDER — FENTANYL CITRATE (PF) 100 MCG/2ML IJ SOLN
INTRAMUSCULAR | Status: AC
  Administered 2020-05-08: 50 ug via INTRAVENOUS
  Filled 2020-05-08: qty 2

## 2020-05-08 MED ORDER — PHENYLEPHRINE HCL (PRESSORS) 10 MG/ML IV SOLN
INTRAVENOUS | Status: DC | PRN
  Administered 2020-05-08 (×2): 100 ug via INTRAVENOUS

## 2020-05-08 MED ORDER — ACETAMINOPHEN 10 MG/ML IV SOLN
INTRAVENOUS | Status: AC
  Filled 2020-05-08: qty 100

## 2020-05-08 MED ORDER — SCOPOLAMINE 1 MG/3DAYS TD PT72
MEDICATED_PATCH | TRANSDERMAL | Status: AC
  Administered 2020-05-08: 1.5 mg via TRANSDERMAL
  Filled 2020-05-08: qty 1

## 2020-05-08 MED ORDER — SODIUM CHLORIDE 0.9 % IV SOLN: INTRAVENOUS | Status: DC

## 2020-05-08 MED ORDER — BUPIVACAINE HCL (PF) 0.5 % IJ SOLN
INTRAMUSCULAR | Status: DC | PRN
  Administered 2020-05-08: 10 mL via PERINEURAL

## 2020-05-08 MED ORDER — HYDROCODONE-ACETAMINOPHEN 5-325 MG PO TABS: 1.0000 | ORAL_TABLET | Freq: Four times a day (QID) | ORAL | 0 refills | Status: DC | PRN

## 2020-05-08 MED ORDER — FENTANYL CITRATE (PF) 100 MCG/2ML IJ SOLN
INTRAMUSCULAR | Status: AC
  Filled 2020-05-08: qty 2

## 2020-05-08 MED ORDER — BUPIVACAINE-EPINEPHRINE (PF) 0.5% -1:200000 IJ SOLN
INTRAMUSCULAR | Status: AC
  Filled 2020-05-08: qty 30

## 2020-05-08 MED ORDER — DIPHENHYDRAMINE HCL 50 MG/ML IJ SOLN
INTRAMUSCULAR | Status: AC
  Filled 2020-05-08: qty 1

## 2020-05-08 MED ORDER — BUPIVACAINE-EPINEPHRINE 0.5% -1:200000 IJ SOLN
INTRAMUSCULAR | Status: DC | PRN
  Administered 2020-05-08: 30 mL

## 2020-05-08 MED ORDER — DEXAMETHASONE SODIUM PHOSPHATE 10 MG/ML IJ SOLN
INTRAMUSCULAR | Status: AC
  Filled 2020-05-08: qty 1

## 2020-05-08 MED ORDER — ONDANSETRON HCL 4 MG/2ML IJ SOLN
INTRAMUSCULAR | Status: DC | PRN
  Administered 2020-05-08: 4 mg via INTRAVENOUS

## 2020-05-08 MED ORDER — SUGAMMADEX SODIUM 200 MG/2ML IV SOLN
INTRAVENOUS | Status: DC | PRN
  Administered 2020-05-08: 200 mg via INTRAVENOUS

## 2020-05-08 MED ORDER — FENTANYL CITRATE (PF) 100 MCG/2ML IJ SOLN
50.0000 ug | Freq: Once | INTRAMUSCULAR | Status: AC
Start: 2020-05-08 — End: 2020-05-08

## 2020-05-08 MED ORDER — KETAMINE HCL 50 MG/5ML IJ SOSY
PREFILLED_SYRINGE | INTRAMUSCULAR | Status: AC
  Filled 2020-05-08: qty 5

## 2020-05-08 MED ORDER — ONDANSETRON HCL 4 MG/2ML IJ SOLN
4.0000 mg | Freq: Once | INTRAMUSCULAR | Status: DC | PRN
Start: 2020-05-08 — End: 2020-05-08

## 2020-05-08 MED ORDER — BUPIVACAINE HCL (PF) 0.5 % IJ SOLN
INTRAMUSCULAR | Status: AC
  Filled 2020-05-08: qty 10

## 2020-05-08 MED ORDER — CHLORHEXIDINE GLUCONATE 0.12 % MT SOLN
OROMUCOSAL | Status: AC
  Filled 2020-05-08: qty 15

## 2020-05-08 MED ORDER — ONDANSETRON HCL 4 MG PO TABS: 4.0000 mg | ORAL_TABLET | Freq: Four times a day (QID) | ORAL | Status: DC | PRN

## 2020-05-08 MED ORDER — ROCURONIUM BROMIDE 100 MG/10ML IV SOLN
INTRAVENOUS | Status: DC | PRN
  Administered 2020-05-08: 45 mg via INTRAVENOUS
  Administered 2020-05-08: 5 mg via INTRAVENOUS
  Administered 2020-05-08 (×2): 10 mg via INTRAVENOUS

## 2020-05-08 MED ORDER — LIDOCAINE HCL (PF) 2 % IJ SOLN
INTRAMUSCULAR | Status: AC
  Filled 2020-05-08: qty 5

## 2020-05-08 MED ORDER — HYDROCODONE-ACETAMINOPHEN 5-325 MG PO TABS: 1.0000 | ORAL_TABLET | ORAL | Status: DC | PRN

## 2020-05-08 SURGICAL SUPPLY — 55 items
ANCH SUT 2 2.9 2 LD TPR NDL (Anchor) ×1 IMPLANT
ANCH SUT KNTLS STRL SHLDR SYS (Anchor) ×2 IMPLANT
ANCH SUT Q-FX 2.8 (Anchor) ×4 IMPLANT
ANCHOR ALL-SUT Q-FIX 2.8 (Anchor) ×4 IMPLANT
ANCHOR JUGGERKNOT WTAP NDL 2.9 (Anchor) ×1 IMPLANT
ANCHOR SUT QUATTRO KNTLS 4.5 (Anchor) ×4 IMPLANT
APL PRP STRL LF DISP 70% ISPRP (MISCELLANEOUS) ×1
BIT DRILL JUGRKNT W/NDL BIT2.9 (DRILL) ×1 IMPLANT
BLADE FULL RADIUS 3.5 (BLADE) ×2 IMPLANT
BUR ACROMIONIZER 4.0 (BURR) ×2 IMPLANT
CANNULA SHAVER 8MMX76MM (CANNULA) ×2 IMPLANT
CHLORAPREP W/TINT 26 (MISCELLANEOUS) ×2 IMPLANT
COVER MAYO STAND REUSABLE (DRAPES) ×2 IMPLANT
COVER WAND RF STERILE (DRAPES) ×2 IMPLANT
DRAPE IMP U-DRAPE 54X76 (DRAPES) ×4 IMPLANT
DRILL JUGGERKNOT W/NDL BIT 2.9 (DRILL) ×2
ELECT CAUTERY BLADE 6.4 (BLADE) ×2 IMPLANT
ELECT REM PT RETURN 9FT ADLT (ELECTROSURGICAL) ×2
ELECTRODE REM PT RTRN 9FT ADLT (ELECTROSURGICAL) ×1 IMPLANT
GAUZE SPONGE 4X4 12PLY STRL (GAUZE/BANDAGES/DRESSINGS) ×1 IMPLANT
GAUZE XEROFORM 1X8 LF (GAUZE/BANDAGES/DRESSINGS) ×2 IMPLANT
GLOVE SRG 8 PF TXTR STRL LF DI (GLOVE) ×1 IMPLANT
GLOVE SURG ENC MOIS LTX SZ7.5 (GLOVE) ×4 IMPLANT
GLOVE SURG ENC MOIS LTX SZ8 (GLOVE) ×4 IMPLANT
GLOVE SURG PR MICRO ENCORE 7.5 (GLOVE) ×1 IMPLANT
GLOVE SURG UNDER LTX SZ8 (GLOVE) ×2 IMPLANT
GLOVE SURG UNDER POLY LF SZ8 (GLOVE) ×2
GOWN STRL REUS W/ TWL LRG LVL3 (GOWN DISPOSABLE) ×1 IMPLANT
GOWN STRL REUS W/ TWL XL LVL3 (GOWN DISPOSABLE) ×1 IMPLANT
GOWN STRL REUS W/TWL LRG LVL3 (GOWN DISPOSABLE) ×2
GOWN STRL REUS W/TWL XL LVL3 (GOWN DISPOSABLE) ×2
GRAFT TISS 35X35 2 THK DERM (Tissue) IMPLANT
GRASPER SUT 15 45D LOW PRO (SUTURE) IMPLANT
IV LACTATED RINGER IRRG 3000ML (IV SOLUTION) ×4
IV LR IRRIG 3000ML ARTHROMATIC (IV SOLUTION) ×2 IMPLANT
KIT CANNULA 8X76-LX IN CANNULA (CANNULA) IMPLANT
KIT SUTURE 2.8 Q-FIX DISP (MISCELLANEOUS) ×2 IMPLANT
MANIFOLD NEPTUNE II (INSTRUMENTS) ×2 IMPLANT
MASK FACE SPIDER DISP (MASK) ×2 IMPLANT
MAT ABSORB  FLUID 56X50 GRAY (MISCELLANEOUS) ×2
MAT ABSORB FLUID 56X50 GRAY (MISCELLANEOUS) ×1 IMPLANT
PACK ARTHROSCOPY SHOULDER (MISCELLANEOUS) ×2 IMPLANT
PASSER SUT FIRSTPASS SELF (INSTRUMENTS) ×2 IMPLANT
SLING ARM LRG DEEP (SOFTGOODS) ×1 IMPLANT
SLING ULTRA II LG (MISCELLANEOUS) ×1 IMPLANT
STAPLER SKIN PROX 35W (STAPLE) ×2 IMPLANT
STRAP SAFETY 5IN WIDE (MISCELLANEOUS) ×2 IMPLANT
SUT ETHIBOND 0 MO6 C/R (SUTURE) ×2 IMPLANT
SUT ULTRABRAID 2 COBRAID 38 (SUTURE) ×5 IMPLANT
SUT VIC AB 2-0 CT1 27 (SUTURE) ×4
SUT VIC AB 2-0 CT1 TAPERPNT 27 (SUTURE) ×2 IMPLANT
TAPE MICROFOAM 4IN (TAPE) ×2 IMPLANT
TISSUE ARTHROFLEX DERMUS 35X35 (Tissue) ×2 IMPLANT
TUBING ARTHRO INFLOW-ONLY STRL (TUBING) ×2 IMPLANT
WAND WEREWOLF FLOW 90D (MISCELLANEOUS) ×2 IMPLANT

## 2020-05-08 NOTE — Anesthesia Preprocedure Evaluation (Addendum)
Anesthesia Evaluation  Patient identified by MRN, date of birth, ID band Patient awake    Reviewed: Allergy & Precautions, NPO status , Patient's Chart, lab work & pertinent test results  History of Anesthesia Complications (+) PONV and history of anesthetic complications  Airway Mallampati: II       Dental   Pulmonary neg sleep apnea, neg COPD, Not current smoker,           Cardiovascular (-) hypertension(-) Past MI and (-) CHF (-) dysrhythmias (-) Valvular Problems/Murmurs     Neuro/Psych neg Seizures    GI/Hepatic Neg liver ROS, GERD  Medicated and Controlled,  Endo/Other  diabetes, Type 2, Oral Hypoglycemic AgentsHypothyroidism   Renal/GU negative Renal ROS     Musculoskeletal   Abdominal   Peds  Hematology  (+) anemia ,   Anesthesia Other Findings   Reproductive/Obstetrics                            Anesthesia Physical Anesthesia Plan  ASA: III  Anesthesia Plan: General   Post-op Pain Management:  Regional for Post-op pain   Induction: Intravenous  PONV Risk Score and Plan:   Airway Management Planned: Oral ETT  Additional Equipment:   Intra-op Plan:   Post-operative Plan:   Informed Consent: I have reviewed the patients History and Physical, chart, labs and discussed the procedure including the risks, benefits and alternatives for the proposed anesthesia with the patient or authorized representative who has indicated his/her understanding and acceptance.       Plan Discussed with:   Anesthesia Plan Comments:         Anesthesia Quick Evaluation

## 2020-05-08 NOTE — H&P (Signed)
History of Present Illness: Suzanne Munoz is a 55 y.o. female who presents today for her surgical history and physical. The patient is scheduled for a right shoulder arthroscopy with debridement, decompression, rotator cuff repair, distal clavicle excision and possible biceps tenodesis of the right shoulder. Surgery scheduled with Dr. Joice Lofts on 05/08/2020. The patient denies any changes in her medical history since she was last evaluated. She denies any personal history of heart attack, stroke, asthma or COPD. No personal history of blood clots. The patient denies any recent falls or trauma affecting the right shoulder. Pain score today is still 5 out of 10.  Past Medical History: . Allergic state  . Anemia (A few years ago - not consistent)  . Arthritis in spine  . Asthma without status asthmaticus, unspecified (Allergy induced)  . B12 deficiency 10/19/2013  . Benign neoplasm of colon 05/12/2013  . Contusion of foot including toes, right, initial encounter 06/17/2016  . DDD (degenerative disc disease), lumbar 07/21/2013  . Diabetes (HCC) 05/12/2013  . Diabetes mellitus type 2, uncomplicated (CMS-HCC)  . Extremity pain 10/19/2013  . GERD (gastroesophageal reflux disease)  . Heart murmur, unspecified 2 years ago (Seen by cardiologist to create a baseline but no issues)  . Heartburn  . Hip pain 10/19/2013  . Hip pain, left 10/19/2014  . History of shingles  . Hx of adenomatous colonic polyps  . Hyperlipidemia  . Hypothyroid, unspecified 05/12/2013  . Hypothyroidism  . IBS (irritable bowel syndrome)  . Iron deficiency 10/19/2013  . Lumbar radiculitis 07/21/2013  . OA (osteoarthritis) of hip 07/21/2013  . Obesity 10/19/2013  . Pain of left lower extremity 10/19/2014  . Pelvic pain in female 10/19/2013  . Personal history of colonic polyps 05/12/2013  . Primary osteoarthritis of left hip 12/20/2013  . Trochanteric bursitis of left hip 07/21/2013  . Type 2 diabetes mellitus without complication, without  long-term current use of insulin (CMS-HCC) 12/19/2014   Past Surgical History: . Arthroscopy of Knee Right  . Back Surgery x2 (Dr. Danielle Dess)  . COLONOSCOPY 10/11/2009 (Adenomatous Polyp: CBF 10/2014)  . COLONOSCOPY 01/09/2015 (Adenomatous Polyp: CBF 01/2020)  . EGD 09/15/2011, 10/11/2009 (No repeat per RTE)  . JOINT REPLACEMENT Dec 13, 2015 (Left hip replacement)  . KNEE ARTHROSCOPY 1981 (Right knee)  . Left Foot Surgery x2  . Right Arm Surgery  . TONSILLECTOMY   Past Family History: . Hypothyroidism Mother  . Myocardial Infarction (Heart attack) Mother  . High blood pressure (Hypertension) Mother  . Thyroid disease Mother  . Diabetes Father  . Heart disease Father  . Myocardial Infarction (Heart attack) Father  . Lung cancer Father  . Diabetes type II Father  . Coronary Artery Disease (Blocked arteries around heart) Paternal Grandfather  CABG   Medications: Current Outpatient Medications Ordered in Epic  Medication Sig Dispense Refill  . albuterol 90 mcg/actuation inhaler Inhale 2 inhalations into the lungs every 6 (six) hours as needed for Wheezing 18 g 11  . azelastine (ASTELIN) 137 mcg nasal spray Place 1 spray into both nostrils 2 (two) times daily 30 mL 1  . CONTOUR NEXT TEST STRIPS test strip USE TO CHECK BLOOD SUGAR ONCE DAILY 100 strip 1  . fexofenadine (ALLEGRA) 180 MG tablet Take 180 mg by mouth once daily  . hyoscyamine (LEVSIN) 0.125 mg tablet Take 1 tablet (0.125 mg total) by mouth every 6 (six) hours as needed for Cramping 30 tablet 5  . levonorgestrel (MIRENA) 20 mcg/24 hr (5 years) IUD Insert 1 each into  the uterus once. Follow package directions.  Marland Kitchen lisinopriL (ZESTRIL) 5 MG tablet TAKE 1 TABLET BY MOUTH EVERY DAY 90 tablet 3  . meclizine (ANTIVERT) 12.5 mg tablet Take 1-2 tablets (12.5-25 mg total) by mouth 3 (three) times daily as needed (motion sickness) 20 tablet 1  . metFORMIN (GLUCOPHAGE-XR) 500 MG XR tablet TAKE 2 TABLETS BY MOUTH TWICE A DAY 360 tablet 3   . pantoprazole (PROTONIX) 40 MG DR tablet Take 1 tablet (40 mg total) by mouth once daily 30 tablet 11  . pen needle, diabetic (NOVOFINE PLUS) 32 gauge x 1/6" Ndle Use 1 each as directed For 0.25 mg injection 12 each 3  . semaglutide (OZEMPIC) 0.25 mg or 0.5 mg(2 mg/1.5 mL) pen injector Inject 0.1875 mLs (0.25 mg total) subcutaneously once a week for 30 days 0.75 mL 11  . simvastatin (ZOCOR) 40 MG tablet TAKE 1 TABLET BY MOUTH EVERYDAY AT BEDTIME 90 tablet 1  . SYNTHROID 137 mcg tablet TAKE 1 TABLET BY MOUTH 30-60 MINUTES BEFORE BREAKFAST WITH A FULL GLASS OF WATER. 90 tablet 3  . acetaminophen (TYLENOL) 500 MG tablet Take 1,000 mg by mouth as needed for Pain (Patient not taking: Reported on 05/02/2020 )  . fluticasone propionate (FLOVENT DISKUS) 100 mcg/actuation diskus inhaler Inhale 1 inhalation into the lungs 2 (two) times daily (Patient not taking: Reported on 05/02/2020 ) 1 each 1  . levalbuterol (XOPENEX HFA) inhaler Inhale 2 inhalations into the lungs every 4 (four) hours as needed for Wheezing (Patient not taking: Reported on 05/02/2020 )  . ranitidine (ZANTAC) 150 MG tablet Take 150 mg by mouth 2 (two) times daily (Patient not taking: Reported on 05/02/2020 )   Allergies: . Amoxicillin Itching  . Crestor [Rosuvastatin] Other (Heartburn)  . Desipramine Other (Sweats) . Dexilant [Dexlansoprazole] Other (Increase BG)  . Oxycodone Vomiting   Review of Systems:  A comprehensive 14 point ROS was performed, reviewed by me today, and the pertinent orthopaedic findings are documented in the HPI.  Physical Exam: BP 132/88 (BP Location: Left upper arm, Patient Position: Sitting, BP Cuff Size: Large Adult)  Ht 170.2 cm (5\' 7" )  Wt 97.7 kg (215 lb 6.4 oz)  BMI 33.74 kg/m  General/Constitutional: The patient appears to be well-nourished, well-developed, and in no acute distress. Neuro/Psych: Normal mood and affect, oriented to person, place and time. Eyes: Non-icteric. Pupils are equal, round,  and reactive to light, and exhibit synchronous movement. ENT: Unremarkable. Lymphatic: No palpable adenopathy. Respiratory: Lungs clear to auscultation, Normal chest excursion, No wheezes and Non-labored breathing Cardiovascular: Regular rate and rhythm. No murmurs. and No edema, swelling or tenderness, except as noted in detailed exam. Integumentary: No impressive skin lesions present, except as noted in detailed exam. Musculoskeletal: Unremarkable, except as noted in detailed exam.  General: Well developed, well nourished 55 y.o. female in no apparent distress. Normal affect. Normal communication. Patient answers questions appropriately. The patient has a normal gait. There is no antalgic component. There is no hip lurch.   Right Upper Extremity: Examination of the right shoulder and arm showed no bony abnormality or edema. The patient has normal active and passive motion with abduction, flexion, internal rotation, and external rotation. The patient does begin to experience pain when reaching 85 degrees of forward flexion, and at 105 degrees of abduction. The patient has a positive Hawkins test and a positive impingement test. The patient has a negative drop arm test. The patient is non-tender along the deltoid muscle. There is moderate subacromial space  tenderness with moderate AC joint tenderness. Patient is mildly tender to palpation to the right proximal bicep tendon. The patient's pain is not increased with speeds test however. The patient has no instability of the shoulder with anterior-posterior motion. There is a negative sulcus sign. The rotator cuff muscle strength is 5/5 with supraspinatus, 5/5 with internal rotation, and 5/5 with external rotation. There is no crepitus with range of motion activities.   Neurological: The patient has sensation that is intact to light touch and pinprick bilaterally. The patient has normal grip strength. The patient has full biceps, wrist extension, grip,  and interosseous strength. The patient has 2 + DTRs bilaterally.  Vascular: The patient has less than 2 second capillary refill. The patient has normal ulnar and radial pulses. The patient has normal warmth to touch.   Imaging: True AP, Y-scapular, and axillary views of the right shoulder were obtained at a previous visit. These films demonstrate no evidence for fractures, lytic lesions, or significant degenerative changes. The subacromial space is well-maintained. There is no subacromial or infra-clavicular spurring. She demonstrates a Type II acromion.  MRI OF THE RIGHT SHOULDER WITHOUT CONTRAST:  1. Full-thickness, complete insertional tear of the supraspinatus  tendon with mild tendon retraction but no focal muscular atrophy.  2. Mild infraspinatus tendinosis without tear.  3. The labrum and biceps tendon appear intact.  4. Moderate acromioclavicular degenerative changes with small soft  tissue ganglia projecting superior to the joint.   Both the images and the report were discussed with the patient today.  Impression: 1. Subacromial impingement of right shoulder. 2. Nontraumatic complete tear of right rotator cuff. 3. Biceps tendonitis on right. 4. Arthritis of right acromioclavicular joint.  Plan:  1. Treatment options were discussed today with the patient. 2. The patient is scheduled for a right shoulder arthroscopy with debridement, decompression, rotator cuff repair, distal clavicle excision and possible biceps tendon. The patient is scheduled with Dr. Joice Lofts on 05/08/2020. 3. The patient was instructed on the risk and benefits of surgery and wishes to proceed. 4. This document will serve as the surgical history and physical for the patient. 6. The patient will follow-up per standard postop protocol. They can call the clinic they have any questions, new symptoms develop or symptoms worsen.  The procedure was discussed with the patient, as were the potential risks (including  bleeding, infection, nerve and/or blood vessel injury, persistent or recurrent pain, failure of the repair, progression of arthritis, need for further surgery, blood clots, strokes, heart attacks and/or arhythmias, pneumonia, etc.) and benefits. The patient states her understanding and wishes to proceed.   H&P reviewed and patient re-examined. No changes.

## 2020-05-08 NOTE — Anesthesia Procedure Notes (Signed)
Procedure Name: Intubation Date/Time: 05/08/2020 1:42 PM Performed by: Demetrius Charity, CRNA Pre-anesthesia Checklist: Patient identified, Patient being monitored, Timeout performed, Emergency Drugs available and Suction available Patient Re-evaluated:Patient Re-evaluated prior to induction Oxygen Delivery Method: Circle system utilized Preoxygenation: Pre-oxygenation with 100% oxygen Induction Type: IV induction Ventilation: Mask ventilation without difficulty Laryngoscope Size: Mac and 3 Grade View: Grade I Tube type: Oral Tube size: 7.0 mm Number of attempts: 1 Airway Equipment and Method: Stylet Placement Confirmation: ETT inserted through vocal cords under direct vision,  positive ETCO2 and breath sounds checked- equal and bilateral Secured at: 22 cm Tube secured with: Tape Dental Injury: Teeth and Oropharynx as per pre-operative assessment

## 2020-05-08 NOTE — Anesthesia Procedure Notes (Signed)
Anesthesia Regional Block: Interscalene brachial plexus block   Pre-Anesthetic Checklist: ,, timeout performed, Correct Patient, Correct Site, Correct Laterality, Correct Procedure, Correct Position, site marked, Risks and benefits discussed,  Surgical consent,  Pre-op evaluation,  At surgeon's request and post-op pain management  Laterality: Right  Prep: chloraprep       Needles:  Injection technique: Single-shot  Needle Type: Stimiplex     Needle Length: 10cm  Needle Gauge: 21     Additional Needles:   Procedures:,,,, ultrasound used (permanent image in chart),,,,  Narrative:  Start time: 05/08/2020 12:25 PM End time: 05/08/2020 12:28 PM Injection made incrementally with aspirations every 5 mL.  Performed by: Personally  Anesthesiologist: Alver Fisher, MD  Additional Notes: Functioning IV was confirmed and monitors were applied.  A Stimuplex needle was used. Sterile prep and drape,hand hygiene and sterile gloves were used.  Negative aspiration and negative test dose prior to incremental administration of local anesthetic. The patient tolerated the procedure well.

## 2020-05-08 NOTE — Discharge Instructions (Addendum)
Orthopedic discharge instructions: Keep dressing dry and intact.  May shower after dressing changed on post-op day #4 (Saturday).  Cover staples with Band-Aids after drying off. Apply ice frequently to shoulder. Take ibuprofen 600-800 mg TID with meals for 7-10 days, then as necessary. Take oxycodone as prescribed when needed.  May supplement with ES Tylenol if necessary. Keep shoulder immobilizer on at all times except may remove for bathing purposes. Follow-up in 10-14 days or as scheduled.  AMBULATORY SURGERY  DISCHARGE INSTRUCTIONS   1) The drugs that you were given will stay in your system until tomorrow so for the next 24 hours you should not:  A) Drive an automobile B) Make any legal decisions C) Drink any alcoholic beverage   2) You may resume regular meals tomorrow.  Today it is better to start with liquids and gradually work up to solid foods.  You may eat anything you prefer, but it is better to start with liquids, then soup and crackers, and gradually work up to solid foods.   3) Please notify your doctor immediately if you have any unusual bleeding, trouble breathing, redness and pain at the surgery site, drainage, fever, or pain not relieved by medication.    4) Additional Instructions:  Remove patch from behind right ear tomorrow, 05/09/2020.  Please be mindful after removal to wash hands throughly and do not touch your eyes        Please contact your physician with any problems or Same Day Surgery at [phone], Monday through Friday 6 am to 4 pm, or [place] at [facility] number at [phone].

## 2020-05-08 NOTE — Transfer of Care (Signed)
Immediate Anesthesia Transfer of Care Note  Patient: Colbie D Flood-Coner  Procedure(s) Performed: SHOULDER ARTHROSCOPY WITH DEBRIDEMENT, DECOMPRESSION, DISTAL CLAVICLE EXCISION, ROTATOR CUFF REPAIR AND POSSIBLE BICEP TENODESIS. (Right Shoulder)  Patient Location: PACU  Anesthesia Type:GA combined with regional for post-op pain  Level of Consciousness: awake, drowsy and patient cooperative  Airway & Oxygen Therapy: Patient Spontanous Breathing and Patient connected to face mask oxygen  Post-op Assessment: Report given to RN and Post -op Vital signs reviewed and stable  Post vital signs: Reviewed and stable  Last Vitals:  Vitals Value Taken Time  BP 131/69 05/08/20 1630  Temp    Pulse 77 05/08/20 1630  Resp 19 05/08/20 1630  SpO2 96 % 05/08/20 1630  Vitals shown include unvalidated device data.  Last Pain:  Vitals:   05/08/20 1204  TempSrc: Temporal  PainSc: 0-No pain         Complications: No complications documented.

## 2020-05-08 NOTE — Op Note (Signed)
05/08/2020  4:29 PM  Patient:   Suzanne Munoz  Pre-Op Diagnosis:   Impingement/tendinopathy with full-thickness rotator cuff tear, biceps tendinopathy, and degenerative joint disease of AC joint, right shoulder.  Post-Op Diagnosis:   Same  Procedure:   Limited arthroscopic debridement, arthroscopic subacromial decompression, arthroscopic distal clavicle excision, mini-open rotator cuff repair using a dermal allograft, and mini-open biceps tenodesis, right shoulder.  Anesthesia:   General endotracheal with interscalene block using Exparel placed preoperatively by the anesthesiologist.  Surgeon:   Maryagnes Amos, MD  Assistant:   Horris Latino, PA-C; Deborha Payment, PA-S  Findings:   As above.  There was a large rotator cuff tear involving the entire supraspinatus and anterior portion of the infraspinatus tendon insertions measuring approximately 3.5 x 5 cm.  The remainder the rotator cuff was in satisfactory condition.  There was substantial "lip sticking" of the long head of the biceps tendon without partial or full-thickness tearing.  The labrum was in satisfactory condition, as were the articular surfaces of the glenoid and humerus.  Complications:   None  Fluids:   800 cc  Estimated blood loss:   25 cc  Tourniquet time:   None  Drains:   None  Closure:   Staples      Brief clinical note:   The patient is a 55 year old female with a long history of progressively worsening right shoulder pain. The patient's symptoms have progressed despite medications, activity modification, etc. The patient's history and examination are consistent with impingement/tendinopathy with a rotator cuff tear. These findings were confirmed by MRI scan. The patient presents at this time for definitive management of her shoulder symptoms.  Procedure:   The patient underwent placement of an interscalene block using Exparel by the anesthesiologist in the preoperative holding area before being brought into  the operating room and lain in the supine position. The patient then underwent general endotracheal intubation and anesthesia before being repositioned in the beach chair position using the beach chair positioner. The right shoulder and upper extremity were prepped with ChloraPrep solution before being draped sterilely. Preoperative antibiotics were administered. A timeout was performed to confirm the proper surgical site before the expected portal sites and incision site were injected with 0.5% Sensorcaine with epinephrine.   A posterior portal was created and the glenohumeral joint thoroughly inspected with the findings as described above. An anterior portal was created using an outside-in technique. The labrum and rotator cuff were further probed, again confirming the above-noted findings. Areas of labral fraying and synovitis were debrided back to stable margins and full-radius sector, as were the torn margins of the rotator cuff tear. The ArthroCare wand was inserted and used to release the biceps tendon from its labral anchor. It also was used to obtain hemostasis as well as to "anneal" the labrum superiorly and anteriorly. The instruments were removed from the joint after suctioning the excess fluid.  The camera was repositioned through the posterior portal into the subacromial space. A separate lateral portal was created using an outside-in technique. The 3.5 mm full-radius resector was introduced and used to perform a subtotal bursectomy. The ArthroCare wand was then inserted and used to remove the periosteal tissue off the undersurface of the anterior third of the acromion as well as to recess the coracoacromial ligament from its attachment along the anterior and lateral margins of the acromion. The 4.0 mm acromionizing bur was introduced and used to complete the decompression by removing the undersurface of the anterior third of the  acromion. The full radius resector was reintroduced to remove any  residual bony debris before the ArthroCare wand was reintroduced to obtain hemostasis.   Attention was directed more medially. The ArthroCare wand was used to debride the undersurface of the distal clavicle before it was used to skeletonize the distal end of the clavicle anteriorly, inferiorly, and posteriorly. The 4 mm acromionizing bur was reinserted and used to remove the undersurface of the distal clavicle. The camera was repositioned through the lateral portal while the 4 mm acromionizing bur was introduced through the anterior portal and used to complete the distal clavicle excision. The adequacy of excision was verified arthroscopically from both the lateral and anterior portals. The ArthroCare wand was reintroduced to obtain hemostasis. The instruments were then removed from the subacromial space after suctioning the excess fluid.  An approximately 4-5 cm incision was made over the anterolateral aspect of the shoulder beginning at the anterolateral corner of the acromion and extending distally in line with the bicipital groove. This incision was carried down through the subcutaneous tissues to expose the deltoid fascia. The raphae between the anterior and middle thirds was identified and this plane developed to provide access into the subacromial space. Additional bursal tissues were debrided sharply using Metzenbaum scissors. The rotator cuff tear was readily identified. The margins were debrided sharply with a #15 blade and the exposed greater tuberosity roughened with a rongeur.  An attempt was made to repair the tear but the tear was deemed to retracted despite extensive mobilization efforts.  Therefore, the lateral-most margin of the tear was repaired back to the medial-most edge of the greater tuberosity using two Smith [employer] Nephew 2.8 mm Q-Fix anchors. The sutures were tied after passing 5 additional horizontal mattress sutures through the rotator cuff margin to secure a human dermal allograft patch  cut to the appropriate size. Once the sutures from the Q fix anchors were secured, the 5 additional horizontal mattress sutures were knotted to secure the graft. Laterally, the graft was secured to the lateral edge of the greater tuberosity utilizing two additional Smith [employer] Nephew 2.8 mm Q-Fix anchors. The more medial graft sutures were then brought back laterally and secured using two [place] QuatroLink anchors to reinforce the two-layer closure. An apparent watertight closure was obtained.  The bicipital groove was identified by palpation and opened for 1-1.5 cm. The biceps tendon stump was retrieved through this defect. The floor of the bicipital groove was roughened with a curet before a a single Biomet 2.9 mm JuggerKnot anchor was inserted. Both sets of sutures were passed through the biceps tendon and tied securely to effect the tenodesis. The bicipital sheath was reapproximated using two #0 Ethibond interrupted sutures, incorporating the biceps tendon to further reinforce the tenodesis.  The wound was copiously irrigated with sterile saline solution before the deltoid raphae was reapproximated using 2-0 Vicryl interrupted sutures. The subcutaneous tissues were closed in two layers using 2-0 Vicryl interrupted sutures before the skin was closed using staples. The portal sites also were closed using staples. A sterile bulky dressing was applied to the shoulder before the arm was placed into a shoulder immobilizer. The patient was then awakened, extubated, and returned to the recovery room in satisfactory condition after tolerating the procedure well.

## 2020-05-09 ENCOUNTER — Encounter: Payer: Self-pay | Admitting: Surgery

## 2020-05-09 NOTE — Anesthesia Postprocedure Evaluation (Signed)
Anesthesia Post Note  Patient: Lesslie D Flood-Coner  Procedure(s) Performed: SHOULDER ARTHROSCOPY WITH DEBRIDEMENT, DECOMPRESSION, DISTAL CLAVICLE EXCISION, ROTATOR CUFF REPAIR AND POSSIBLE BICEP TENODESIS. (Right Shoulder)  Patient location during evaluation: PACU Anesthesia Type: General Level of consciousness: awake and alert Pain management: pain level controlled Vital Signs Assessment: post-procedure vital signs reviewed and stable Respiratory status: spontaneous breathing, nonlabored ventilation, respiratory function stable and patient connected to nasal cannula oxygen Cardiovascular status: blood pressure returned to baseline and stable Postop Assessment: no apparent nausea or vomiting Anesthetic complications: no   No complications documented.   Last Vitals:  Vitals:   05/08/20 1700 05/08/20 1715  BP: (!) 147/81 (!) 150/75  Pulse: 81 76  Resp: 20 15  Temp: 36.4 C (!) 36.3 C  SpO2: 93% 96%    Last Pain:  Vitals:   05/08/20 1715  TempSrc: Temporal  PainSc: 0-No pain                 Lenard Simmer

## 2020-05-10 DIAGNOSIS — M75121 Complete rotator cuff tear or rupture of right shoulder, not specified as traumatic: Secondary | ICD-10-CM | POA: Insufficient documentation

## 2020-05-10 DIAGNOSIS — M19011 Primary osteoarthritis, right shoulder: Secondary | ICD-10-CM | POA: Insufficient documentation

## 2020-05-10 DIAGNOSIS — M7521 Bicipital tendinitis, right shoulder: Secondary | ICD-10-CM | POA: Insufficient documentation

## 2020-08-07 ENCOUNTER — Ambulatory Visit (INDEPENDENT_AMBULATORY_CARE_PROVIDER_SITE_OTHER): Payer: BC Managed Care – PPO | Admitting: Podiatry

## 2020-08-07 ENCOUNTER — Encounter: Payer: Self-pay | Admitting: Podiatry

## 2020-08-07 ENCOUNTER — Other Ambulatory Visit: Payer: Self-pay

## 2020-08-07 DIAGNOSIS — B351 Tinea unguium: Secondary | ICD-10-CM | POA: Diagnosis not present

## 2020-08-07 MED ORDER — TERBINAFINE HCL 250 MG PO TABS: 250.0000 mg | ORAL_TABLET | Freq: Every day | ORAL | 0 refills | Status: AC

## 2020-08-07 NOTE — Progress Notes (Signed)
     Subjective: 55 y.o. female presenting today as a reestablish new patient for evaluation of thickening and discoloration to the bilateral great toenails is been present for the last few months.  She has been trying topical over-the-counter antifungal medication with minimal improvement.  She presents for further treatment and evaluation.  She denies a history of injury to the toenails.  Gradual onset.  Past Medical History:  Diagnosis Date   Anemia    Diabetes mellitus without complication (HCC)    GERD (gastroesophageal reflux disease)    Hypothyroidism    PONV (postoperative nausea and vomiting)    Thyroid disease     Objective: Physical Exam General: The patient is alert and oriented x3 in no acute distress.  Dermatology: Hyperkeratotic, discolored, thickened, onychodystrophy noted to the bilateral great toes. Skin is warm, dry and supple bilateral lower extremities. Negative for open lesions or macerations.  Vascular: Palpable pedal pulses bilaterally. No edema or erythema noted. Capillary refill within normal limits.  Neurological: Epicritic and protective threshold grossly intact bilaterally.   Musculoskeletal Exam: Range of motion within normal limits to all pedal and ankle joints bilateral. Muscle strength 5/5 in all groups bilateral.   Assessment: #1 onychomycosis of toenails bilateral great toes  Plan of Care:  #1 Patient was evaluated. #2  Today we discussed different treatment options including topical, oral, and laser antifungal treatment modalities.  We discussed the risks and benefits of each.  Efficacy is also discussed.  Patient opts for oral antifungal medication treatment.  She denies a history of liver pathology or symptoms.  Labs taken January 2022 within normal limits.  She has had no change in medications. #3 prescription for Lamisil 250 mg #90 daily #4 return to clinic as needed   Felecia Shelling, DPM [facility]  Dr. Felecia Shelling,  DPM    [address] [place], [place] [address]                Office [phone]  Fax [phone]

## 2020-08-07 NOTE — Addendum Note (Signed)
Addended by: Felecia Shelling on: 08/07/2020 10:33 AM   Modules accepted: Level of Service

## 2020-09-11 ENCOUNTER — Other Ambulatory Visit: Payer: Self-pay | Admitting: Internal Medicine

## 2020-09-11 DIAGNOSIS — R3129 Other microscopic hematuria: Secondary | ICD-10-CM

## 2020-09-13 ENCOUNTER — Ambulatory Visit
Admission: RE | Admit: 2020-09-13 | Discharge: 2020-09-13 | Disposition: A | Discharge status: Home / Self Care | Payer: BC Managed Care – PPO | Source: Ambulatory Visit | Attending: Internal Medicine | Admitting: Internal Medicine

## 2020-09-13 ENCOUNTER — Other Ambulatory Visit: Payer: Self-pay

## 2020-09-13 ENCOUNTER — Ambulatory Visit: Payer: BC Managed Care – PPO

## 2020-09-13 DIAGNOSIS — R3129 Other microscopic hematuria: Secondary | ICD-10-CM | POA: Insufficient documentation

## 2020-09-24 ENCOUNTER — Other Ambulatory Visit: Payer: Self-pay | Admitting: Surgery

## 2020-09-25 ENCOUNTER — Other Ambulatory Visit: Payer: Self-pay

## 2020-09-25 ENCOUNTER — Encounter
Admission: RE | Admit: 2020-09-25 | Discharge: 2020-09-25 | Disposition: A | Discharge status: Home / Self Care | Payer: BC Managed Care – PPO | Source: Ambulatory Visit | Attending: Surgery | Admitting: Surgery

## 2020-09-25 HISTORY — DX: Unspecified asthma, uncomplicated: J45.909

## 2020-09-25 NOTE — Patient Instructions (Addendum)
Your procedure is scheduled on: 09/26/2020 Report to the Registration Desk on the 1st floor of the [facility]. To find out your arrival time, please call [phone] between 1PM - 3PM on: 09/25/2020  REMEMBER: Instructions that are not followed completely may result in serious medical risk, up to and including death; or upon the discretion of your surgeon and anesthesiologist your surgery may need to be rescheduled.  Do not eat food after midnight the night before surgery.  No gum chewing, lozengers or hard candies.  You may however, water liquids up to 2 hours before you are scheduled to arrive for your surgery. Do not drink anything within 2 hours of your scheduled arrival time.  Type 1 and Type 2 diabetics should only drink water.   TAKE THESE MEDICATIONS THE MORNING OF SURGERY WITH A SIP OF WATER: Allegra 2. Protonix (take one the night before and one on the morning of surgery - helps to prevent nausea after surgery.) 3. Synthroid 4. Zofran as needed 5. Hyoscyamine  Use albuterol inhaler on the day of surgery and bring to the hospital.  Stop Metformin  2 days prior to surgery. Last dose will be September 23, 2020     One week prior to surgery: Stop Anti-inflammatories (NSAIDS) such as Advil, Aleve, Ibuprofen, Motrin, Naproxen, Naprosyn and Aspirin based products such as Excedrin, Goodys Powder, BC Powder. Stop ANY OVER THE COUNTER supplements until after surgery like Black cohosh, multivitamin,  You may however, continue to take Tylenol if needed for pain up until the day of surgery.  No Alcohol for 24 hours before or after surgery.  No Smoking including e-cigarettes for 24 hours prior to surgery.  No chewable tobacco products for at least 6 hours prior to surgery.  No nicotine patches on the day of surgery.  Do not use any "recreational" drugs for at least a week prior to your surgery.  Please be advised that the combination of cocaine and anesthesia may have negative  outcomes, up to and including death. If you test positive for cocaine, your surgery will be cancelled.  On the morning of surgery brush your teeth with toothpaste and water, you may rinse your mouth with mouthwash if you wish. Do not swallow any toothpaste or mouthwash.  Do not wear jewelry, make-up, hairpins, clips or nail polish.  Do not wear lotions, powders, or perfumes.   Do not shave body from the neck down 48 hours prior to surgery just in case you cut yourself which could leave a site for infection.  Also, freshly shaved skin may become irritated if using the CHG soap.  Contact lenses, hearing aids and dentures may not be worn into surgery.  Do not bring valuables to the hospital. [facility] is not responsible for any missing/lost belongings or valuables.   Use CHG Soap or wipes as directed on instruction sheet.-provided for you   Notify your doctor if there is any change in your medical condition (cold, fever, infection).  Wear comfortable clothing (specific to your surgery type) to the hospital.  After surgery, you can help prevent lung complications by doing breathing exercises.  Take deep breaths and cough every 1-2 hours. Your doctor may order a device called an Incentive Spirometer to help you take deep breaths.  If you are being admitted to the hospital overnight, leave your suitcase in the car. After surgery it may be brought to your room.  If you are being discharged the day of surgery, you will not be  allowed to drive home. You will need a responsible adult (18 years or older) to drive you home and stay with you that night.   If you are taking public transportation, you will need to have a responsible adult (18 years or older) with you. Please confirm with your physician that it is acceptable to use public transportation.   Please call the Pre-admissions Testing Dept. at [phone] if you have any questions about these instructions.  Surgery Visitation  Policy:  Patients undergoing a surgery or procedure may have one family member or support person with them as long as that person is not COVID-19 positive or experiencing its symptoms.  That person may remain in the waiting area during the procedure.  Inpatient Visitation:    Visiting hours are 7 a.m. to 8 p.m. Inpatients will be allowed two visitors daily. The visitors may change each day during the patient's stay. No visitors under the age of 41. Any visitor under the age of 58 must be accompanied by an adult. The visitor must pass COVID-19 screenings, use hand sanitizer when entering and exiting the patient's room and wear a mask at all times, including in the patient's room. Patients must also wear a mask when staff or their visitor are in the room. Masking is required regardless of vaccination status.

## 2020-09-25 NOTE — Progress Notes (Signed)
  Perioperative Services Pre-Admission/Anesthesia Testing     Date: 09/25/20  Name: Suzanne Munoz MRN:   [id number]  Re: Change in ABX for upcoming surgery   Case: [id number] Date/Time: 09/26/20 1416   Procedure: MANIPULATION UNDER ANESTHESIA WITH STEROID INJECTION OF RIGHT SHOULDER (Right: Shoulder)   Anesthesia type: Choice   Pre-op diagnosis:      DJD of right AC joint M19.011     Nontraumatic complete tear of right rotator cuff M75.121     Rotator cuff tendinitis, right M75.81     Tendinitis of upper biceps tendon of right shoulder M75.21   Location: [facility] OR ROOM 02 / [facility] ORS FOR ANESTHESIA [facility]   Surgeons: Christena Flake, MD     Patient recently underwent elective orthopedic procedure on 05/08/2020 with Dr. Leron Croak, MD. patient has a documented allergy to amoxicillin.  For the aforementioned procedure, patient's presurgical prophylactic antibiotic was changed from clindamycin to cefazolin.  Notes from previous surgical case reviewed.  There is no documentation to suggest that patient had any issues with the previously administered first generation cephalosporin.  We will discontinue order for clindamycin 900 mg IV and place an order for cefazolin 2 g IV to be given on-call to the OR.  We will send copy of this note to primary attending surgeon to make him aware of therapeutic change.  Quentin Mulling, MSN, APRN, FNP-C, C[facility]  Peri-operative Services Nurse Practitioner Phone: [phone] 09/25/20 11:24 AM

## 2020-09-26 ENCOUNTER — Ambulatory Visit
Admission: RE | Admit: 2020-09-26 | Discharge: 2020-09-26 | Disposition: A | Discharge status: Home / Self Care | Payer: BC Managed Care – PPO | Attending: Surgery | Admitting: Surgery

## 2020-09-26 ENCOUNTER — Ambulatory Visit: Payer: BC Managed Care – PPO | Admitting: Urgent Care

## 2020-09-26 ENCOUNTER — Encounter: Payer: Self-pay | Admitting: Surgery

## 2020-09-26 ENCOUNTER — Ambulatory Visit: Payer: BC Managed Care – PPO

## 2020-09-26 ENCOUNTER — Encounter
Admission: RE | Disposition: A | Discharge status: Home / Self Care | Payer: Self-pay | Source: Home / Self Care | Attending: Surgery

## 2020-09-26 DIAGNOSIS — M7501 Adhesive capsulitis of right shoulder: Secondary | ICD-10-CM | POA: Insufficient documentation

## 2020-09-26 DIAGNOSIS — E119 Type 2 diabetes mellitus without complications: Secondary | ICD-10-CM | POA: Insufficient documentation

## 2020-09-26 DIAGNOSIS — Z833 Family history of diabetes mellitus: Secondary | ICD-10-CM | POA: Insufficient documentation

## 2020-09-26 DIAGNOSIS — Z8349 Family history of other endocrine, nutritional and metabolic diseases: Secondary | ICD-10-CM | POA: Insufficient documentation

## 2020-09-26 DIAGNOSIS — M19011 Primary osteoarthritis, right shoulder: Secondary | ICD-10-CM | POA: Diagnosis not present

## 2020-09-26 DIAGNOSIS — Z793 Long term (current) use of hormonal contraceptives: Secondary | ICD-10-CM | POA: Insufficient documentation

## 2020-09-26 DIAGNOSIS — Z8249 Family history of ischemic heart disease and other diseases of the circulatory system: Secondary | ICD-10-CM | POA: Insufficient documentation

## 2020-09-26 DIAGNOSIS — Z801 Family history of malignant neoplasm of trachea, bronchus and lung: Secondary | ICD-10-CM | POA: Insufficient documentation

## 2020-09-26 DIAGNOSIS — Z7984 Long term (current) use of oral hypoglycemic drugs: Secondary | ICD-10-CM | POA: Insufficient documentation

## 2020-09-26 DIAGNOSIS — Z791 Long term (current) use of non-steroidal anti-inflammatories (NSAID): Secondary | ICD-10-CM | POA: Diagnosis not present

## 2020-09-26 DIAGNOSIS — Z880 Allergy status to penicillin: Secondary | ICD-10-CM | POA: Diagnosis not present

## 2020-09-26 DIAGNOSIS — Z79899 Other long term (current) drug therapy: Secondary | ICD-10-CM | POA: Diagnosis not present

## 2020-09-26 DIAGNOSIS — Z888 Allergy status to other drugs, medicaments and biological substances status: Secondary | ICD-10-CM | POA: Diagnosis not present

## 2020-09-26 DIAGNOSIS — Z885 Allergy status to narcotic agent status: Secondary | ICD-10-CM | POA: Insufficient documentation

## 2020-09-26 DIAGNOSIS — Z7951 Long term (current) use of inhaled steroids: Secondary | ICD-10-CM | POA: Diagnosis not present

## 2020-09-26 HISTORY — PX: CLOSED MANIPULATION SHOULDER WITH STERIOD INJECTION: SHX5611

## 2020-09-26 LAB — GLUCOSE, CAPILLARY
Glucose-Capillary: 150 mg/dL — ABNORMAL HIGH (ref 70–99)
Glucose-Capillary: 133 mg/dL — ABNORMAL HIGH (ref 70–99)

## 2020-09-26 LAB — POCT PREGNANCY, URINE: Preg Test, Ur: NEGATIVE

## 2020-09-26 SURGERY — CLOSED MANIPULATION SHOULDER WITH STEROID INJECTION
Anesthesia: General | Site: Shoulder | Laterality: Right

## 2020-09-26 MED ORDER — PROPOFOL 10 MG/ML IV BOLUS
INTRAVENOUS | Status: DC | PRN
  Administered 2020-09-26: 50 mg via INTRAVENOUS
  Administered 2020-09-26: 30 mg via INTRAVENOUS

## 2020-09-26 MED ORDER — TRIAMCINOLONE ACETONIDE 40 MG/ML IJ SUSP
INTRAMUSCULAR | Status: DC | PRN
  Administered 2020-09-26: 10 mL

## 2020-09-26 MED ORDER — FENTANYL CITRATE (PF) 100 MCG/2ML IJ SOLN
25.0000 ug | INTRAMUSCULAR | Status: DC | PRN
  Administered 2020-09-26: 50 ug via INTRAVENOUS

## 2020-09-26 MED ORDER — SODIUM CHLORIDE 0.9 % IV SOLN: INTRAVENOUS | Status: DC

## 2020-09-26 MED ORDER — CHLORHEXIDINE GLUCONATE 0.12 % MT SOLN
OROMUCOSAL | Status: AC
  Filled 2020-09-26: qty 15

## 2020-09-26 MED ORDER — FENTANYL CITRATE (PF) 100 MCG/2ML IJ SOLN
INTRAMUSCULAR | Status: AC
  Filled 2020-09-26: qty 2

## 2020-09-26 MED ORDER — CEFAZOLIN SODIUM-DEXTROSE 2-4 GM/100ML-% IV SOLN
INTRAVENOUS | Status: AC
  Filled 2020-09-26: qty 100

## 2020-09-26 MED ORDER — FENTANYL CITRATE (PF) 100 MCG/2ML IJ SOLN
INTRAMUSCULAR | Status: AC
  Administered 2020-09-26: 50 ug via INTRAVENOUS
  Filled 2020-09-26: qty 2

## 2020-09-26 MED ORDER — FENTANYL CITRATE (PF) 100 MCG/2ML IJ SOLN
INTRAMUSCULAR | Status: DC | PRN
  Administered 2020-09-26 (×3): 25 ug via INTRAVENOUS

## 2020-09-26 MED ORDER — MIDAZOLAM HCL 2 MG/2ML IJ SOLN
INTRAMUSCULAR | Status: DC | PRN
  Administered 2020-09-26: 2 mg via INTRAVENOUS

## 2020-09-26 MED ORDER — CHLORHEXIDINE GLUCONATE 0.12 % MT SOLN
15.0000 mL | Freq: Once | OROMUCOSAL | Status: AC
  Administered 2020-09-26: 15 mL via OROMUCOSAL

## 2020-09-26 MED ORDER — MIDAZOLAM HCL 2 MG/2ML IJ SOLN
INTRAMUSCULAR | Status: AC
  Filled 2020-09-26: qty 2

## 2020-09-26 MED ORDER — CEFAZOLIN SODIUM-DEXTROSE 2-4 GM/100ML-% IV SOLN: 2.0000 g | Freq: Once | INTRAVENOUS | Status: DC

## 2020-09-26 MED ORDER — ORAL CARE MOUTH RINSE: 15.0000 mL | Freq: Once | OROMUCOSAL | Status: AC

## 2020-09-26 SURGICAL SUPPLY — 11 items
BNDG ADH 1X3 SHEER STRL LF (GAUZE/BANDAGES/DRESSINGS) ×2 IMPLANT
BNDG ADH THN 3X1 STRL LF (GAUZE/BANDAGES/DRESSINGS) ×1
KIT TURNOVER KIT A (KITS) IMPLANT
MANIFOLD NEPTUNE II (INSTRUMENTS) ×1 IMPLANT
NDL HYPO 21X1.5 SAFETY (NEEDLE) ×1 IMPLANT
NEEDLE HYPO 21X1.5 SAFETY (NEEDLE) ×2 IMPLANT
PAD ALCOHOL SWAB (MISCELLANEOUS) ×4 IMPLANT
SLING ARM LRG DEEP (SOFTGOODS) IMPLANT
SLING ARM M TX990204 (SOFTGOODS) ×1 IMPLANT
SYR 10ML LL (SYRINGE) ×2 IMPLANT
WATER STERILE IRR 500ML POUR (IV SOLUTION) IMPLANT

## 2020-09-26 NOTE — Discharge Instructions (Addendum)
Orthopedic discharge instructions: May shower tonight. May use sling as needed for comfort tonight, then try to wean away from it. Apply ice to affected area frequently. Take ibuprofen 600 mg TID with meals for 7-10 days, then as necessary. Take ES Tylenol or pain medication as prescribed when needed.  Start PT tomorrow as scheduled. Return for follow-up in 10-14 days or as scheduled.AMBULATORY SURGERY  DISCHARGE INSTRUCTIONS   The drugs that you were given will stay in your system until tomorrow so for the next 24 hours you should not:  Drive an automobile Make any legal decisions Drink any alcoholic beverage   You may resume regular meals tomorrow.  Today it is better to start with liquids and gradually work up to solid foods.  You may eat anything you prefer, but it is better to start with liquids, then soup and crackers, and gradually work up to solid foods.   Please notify your doctor immediately if you have any unusual bleeding, trouble breathing, redness and pain at the surgery site, drainage, fever, or pain not relieved by medication.    Additional Instructions:  Please contact your physician with any problems or Same Day Surgery at [phone], Monday through Friday 6 am to 4 pm, or [place] [facility] number at [phone].  

## 2020-09-26 NOTE — H&P (Signed)
History of Present Illness:  Suzanne Munoz is a 55 y.o. female who presents for follow-up now 4.5 months status post a right shoulder arthroscopy with limited arthroscopic debridement, arthroscopic subacromial decompression, arthroscopic distal clavicle excision, mini-open rotator cuff repair using a dermal allograft, and mini-open biceps tenodesis. The patient still notes moderate discomfort in her shoulder as well as "tightness". She rates her pain this morning at 6/10, but did just come from physical therapy. She has been attending physical therapy on a regular basis, as well as trying to do exercises on her own at home. She has been taking ibuprofen on a regular basis supplemented with Tylenol as necessary with temporary partial relief of her symptoms. She still has difficulty reaching up overhead and reaching behind her back. She also has some discomfort at night. She denies any reinjury to the shoulder, and denies any fevers or chills.  Current Outpatient Medications:  acetaminophen (TYLENOL) 500 MG tablet Take 1,000 mg by mouth as needed for Pain   albuterol 90 mcg/actuation inhaler Inhale 2 inhalations into the lungs every 6 (six) hours as needed for Wheezing 18 g 11   azelastine (ASTELIN) 137 mcg nasal spray Place 1 spray into both nostrils 2 (two) times daily 30 mL 1   CONTOUR NEXT TEST STRIPS test strip USE TO CHECK BLOOD SUGAR ONCE DAILY 100 strip 1   fexofenadine (ALLEGRA) 180 MG tablet Take 180 mg by mouth once daily   fluticasone propionate (FLOVENT DISKUS) 100 mcg/actuation diskus inhaler Inhale 1 inhalation into the lungs 2 (two) times daily 1 each 1   ibuprofen (MOTRIN) 800 MG tablet Take 1 tablet (800 mg total) by mouth every 8 (eight) hours as needed for Pain 60 tablet 2   levalbuterol (XOPENEX HFA) inhaler Inhale 2 inhalations into the lungs every 4 (four) hours as needed for Wheezing   levonorgestrel (MIRENA) 20 mcg/24 hr (5 years) IUD Insert 1 each into the uterus once Follow  package directions.   lisinopriL (ZESTRIL) 5 MG tablet TAKE 1 TABLET BY MOUTH EVERY DAY 90 tablet 3   meclizine (ANTIVERT) 12.5 mg tablet Take 1-2 tablets (12.5-25 mg total) by mouth 3 (three) times daily as needed (motion sickness) 20 tablet 1   metFORMIN (GLUCOPHAGE-XR) 500 MG XR tablet TAKE 2 TABLETS BY MOUTH TWICE A DAY 360 tablet 3   ondansetron (ZOFRAN-ODT) 4 MG disintegrating tablet Take 4 mg by mouth every 8 (eight) hours as needed   pantoprazole (PROTONIX) 40 MG DR tablet Take 1 tablet (40 mg total) by mouth once daily 30 tablet 11   pen needle, diabetic (NOVOFINE PLUS) 32 gauge x 1/6" Ndle Use 1 each as directed For 0.25 mg injection 12 each 3   ranitidine (ZANTAC) 150 MG tablet Take 150 mg by mouth 2 (two) times daily   simvastatin (ZOCOR) 40 MG tablet TAKE 1 TABLET BY MOUTH EVERYDAY AT BEDTIME 90 tablet 1   SYNTHROID 137 mcg tablet TAKE 1 TABLET BY MOUTH 30-60 MINUTES BEFORE BREAKFAST WITH A FULL GLASS OF WATER. 90 tablet 3   terbinafine HCL (LAMISIL) 250 mg tablet Take 250 mg by mouth once daily   semaglutide (OZEMPIC) 0.25 mg or 0.5 mg(2 mg/1.5 mL) pen injector Inject 0.1875 mLs (0.25 mg total) subcutaneously once a week for 30 days 0.75 mL 11   traMADoL (ULTRAM) 50 mg tablet Take 1 tablet (50 mg total) by mouth every 8 (eight) hours as needed (Patient not taking: Reported on 09/24/2020) 21 tablet 0   Allergies:   Amoxicillin  Itching   Crestor [Rosuvastatin] Other (Heartburn)   Desipramine Other (Sweats)   Dexilant [Dexlansoprazole] Other (Increase BG)   Oxycodone Vomiting   Past Medical History:   Allergic state   Anemia A few years ago, but not consistent   Arthritis in spine   Asthma without status asthmaticus, unspecified (Allergy induced)   B12 deficiency 10/19/2013   Benign neoplasm of colon 05/12/2013   Contusion of foot including toes, right, initial encounter 06/17/2016   DDD (degenerative disc disease), lumbar 07/21/2013   Diabetes (HCC) 05/12/2013   Diabetes mellitus  type 2, uncomplicated (CMS-HCC)   Extremity pain 10/19/2013   GERD (gastroesophageal reflux disease)   Heart murmur, unspecified 2 years ago (Seen by cardiologist to create a baseline but no issues)   Heartburn   Hip pain 10/19/2013   Hip pain, left 10/19/2014   History of shingles   Hx of adenomatous colonic polyps   Hyperlipidemia   Hypothyroid, unspecified 05/12/2013   Hypothyroidism   IBS (irritable bowel syndrome)   Iron deficiency 10/19/2013   Lumbar radiculitis 07/21/2013   OA (osteoarthritis) of hip 07/21/2013   Obesity 10/19/2013   Pain of left lower extremity 10/19/2014   Pelvic pain in female 10/19/2013   Personal history of colonic polyps 05/12/2013   Primary osteoarthritis of left hip 12/20/2013   Trochanteric bursitis of left hip 07/21/2013   Type 2 diabetes mellitus without complication, without long-term current use of insulin (CMS-HCC) 12/19/2014   Past Surgical History:   Limited arthroscopic debridement, arthroscopic subacromial decompression, arthroscopic distal clavicle excision, mini-open rotator cuff repair using a dermal allograft, and mini-open biceps tenodesis, right shoulder. Right 05/08/2020 (Dr. Joice Lofts)   Arthroscopy of Knee Right   Back Surgery x2 (Dr. Danielle Dess)   COLONOSCOPY 10/11/2009  Adenomatous Polyp: CBF 10/2014; Recall Ltr mailed 08/15/2014 (dw); OV made 11/17/2014 @ 9:30am w/Kim Arvilla Market NP (dw)   COLONOSCOPY 01/09/2015 (Adenomatous Polyp: CBF 01/2020)   COLONOSCOPY 07/31/2020 (Tubular adenoma/PHx CP/Repeat 71yrs/TKT)   EGD 09/15/2011, 10/11/2009 (No repeat per RTE)   EGD 07/31/2020 (Reflux esophagitis/No repeat/TKT)   JOINT REPLACEMENT 12/13/2015 (Left hip replacement)   KNEE ARTHROSCOPY 1981 (Right knee)   Left Foot Surgery x2   OTHER SURGERY Left 12/13/2015 (Hip replacement)   Right Arm Surgery   TONSILLECTOMY   Family History:   Hypothyroidism Mother   Myocardial Infarction (Heart attack) Mother   High blood pressure (Hypertension) Mother   Thyroid  disease Mother   Diabetes Father   Heart disease Father   Myocardial Infarction (Heart attack) Father   Lung cancer Father   Diabetes type II Father   Coronary Artery Disease Paternal Grandfather - CABG   Social History:   Socioeconomic History:   Marital status: Married  Tobacco Use   Smoking status: Never Smoker   Smokeless tobacco: Never Used  Building services engineer Use: Never used  Substance and Sexual Activity   Alcohol use: Yes  Alcohol/week: 0.0 standard drinks  Types: 2 Standard drinks or equivalent per week  Comment: Occasionally   Drug use: No   Sexual activity: Yes  Partners: Male  Birth control/protection: I.U.D.   Review of Systems:  A comprehensive 14 point ROS was performed, reviewed, and the pertinent orthopaedic findings are documented in the HPI.  Physical Exam: Vitals:  09/24/20 0837  BP: 134/80  Weight: 100.7 kg (222 lb)  Height: 170.2 cm (5\' 7" )  PainSc: 6  PainLoc: Shoulder   General/Constitutional: The patient appears to be well-nourished, well-developed, and in no acute  distress. Neuro/Psych: Normal mood and affect, oriented to person, place and time.  Eyes: Non-icteric. Pupils are equal, round, and reactive to light, and exhibit synchronous movement. ENT: Unremarkable. Lymphatic: No palpable adenopathy. Respiratory: Lungs clear to auscultation, Normal chest excursion, No wheezes and Non-labored breathing Cardiovascular: Regular rate and rhythm. No murmurs. and No edema, swelling or tenderness, except as noted in detailed exam. Integumentary: No impressive skin lesions present, except as noted in detailed exam. Musculoskeletal: Unremarkable, except as noted in detailed exam.  Right shoulder exam: On examination, her surgical incision and arthroscopic portal sites remain well-healed and without evidence for infection.  No swelling, erythema, ecchymosis, abrasions, or other skin abnormalities are identified. There again is mild residual tenderness  to palpation over the anterior aspect of the shoulder. Actively, she is able to forward flex to 115 degrees, abduct to 105 degrees, and internally rotate to her right buttock. Passively, she can be forward flexed to 130 degrees and abducted to 120 degrees. At 90 degrees of abduction, she is able to tolerate external rotation to 65 degrees and internal rotation to 50 degrees. She describes mild pain at the extremes of these motions.  She demonstrates 4-4+/5 strength with resisted internal and external rotation, and 4/5 with resisted forward flexion and abduction. She again is neurovascularly intact to the right upper extremity and hand.  Assessment:  Adhesive capsulitis of right shoulder.  DJD of right AC (acromioclavicular) joint.   Nontraumatic complete tear of right rotator cuff.   Rotator cuff tendinitis, right.  Tendinitis of upper biceps tendon of right shoulder.   Plan: The treatment options were discussed with the patient. In addition, patient educational materials were provided regarding the diagnosis and treatment options. The patient is quite frustrated by her persistent symptoms and functional limitations, and is ready to consider more aggressive treatment options. Therefore, I have recommended a surgical procedure, specifically a manipulation under anesthesia with steroid injection of the right shoulder. The procedure was discussed with the patient, as were the potential risks (including bleeding, infection, nerve and/or blood vessel injury, persistent or recurrent pain, persistent or recurrent stiffness, humerus fracture, need for further surgery, blood clots, strokes, heart attacks and/or arhythmias, pneumonia, etc.) and benefits. The patient states his/her understanding and wishes to proceed. All of the patient's questions and concerns were answered. She can call any time with further concerns. She will follow up post-surgery, routine.    H&P reviewed and patient re-examined. No  changes.

## 2020-09-26 NOTE — Anesthesia Preprocedure Evaluation (Signed)
Anesthesia Evaluation  Patient identified by MRN, date of birth, ID band Patient awake    Reviewed: Allergy & Precautions, NPO status , Patient's Chart, lab work & pertinent test results  History of Anesthesia Complications (+) PONV and history of anesthetic complications  Airway Mallampati: II       Dental  (+) Dental Advidsory Given   Pulmonary neg shortness of breath, neg sleep apnea, neg COPD, neg recent URI, Not current smoker,           Cardiovascular Exercise Tolerance: Good (-) hypertension(-) angina(-) Past MI and (-) CHF (-) dysrhythmias (-) Valvular Problems/Murmurs     Neuro/Psych neg Seizures negative psych ROS   GI/Hepatic Neg liver ROS, GERD  Medicated and Controlled,  Endo/Other  diabetes, Type 2, Oral Hypoglycemic AgentsHypothyroidism   Renal/GU negative Renal ROS     Musculoskeletal   Abdominal   Peds  Hematology  (+) Blood dyscrasia, anemia ,   Anesthesia Other Findings Past Medical History: No date: Anemia No date: Asthma No date: Diabetes mellitus without complication (HCC) No date: GERD (gastroesophageal reflux disease) No date: Hypothyroidism No date: PONV (postoperative nausea and vomiting) No date: Thyroid disease   Reproductive/Obstetrics                             Anesthesia Physical  Anesthesia Plan  ASA: 3  Anesthesia Plan: General   Post-op Pain Management:  Regional for Post-op pain   Induction: Intravenous  PONV Risk Score and Plan: 4 or greater and TIVA  Airway Management Planned: Simple Face Mask and Natural Airway  Additional Equipment:   Intra-op Plan:   Post-operative Plan:   Informed Consent: I have reviewed the patients History and Physical, chart, labs and discussed the procedure including the risks, benefits and alternatives for the proposed anesthesia with the patient or authorized representative who has indicated his/her  understanding and acceptance.       Plan Discussed with:   Anesthesia Plan Comments:         Anesthesia Quick Evaluation

## 2020-09-26 NOTE — Transfer of Care (Signed)
Immediate Anesthesia Transfer of Care Note  Patient: Suzanne Munoz  Procedure(s) Performed: MANIPULATION UNDER ANESTHESIA WITH STEROID INJECTION OF RIGHT SHOULDER (Right: Shoulder)  Patient Location: PACU  Anesthesia Type:MAC  Level of Consciousness: awake  Airway & Oxygen Therapy: Patient Spontanous Breathing  Post-op Assessment: Report given to RN and Post -op Vital signs reviewed and stable  Post vital signs: Reviewed and stable  Last Vitals:  Vitals Value Taken Time  BP 148/68 09/26/20 1422  Temp    Pulse 87 09/26/20 1425  Resp 16 09/26/20 1425  SpO2 97 % 09/26/20 1425  Vitals shown include unvalidated device data.  Last Pain:  Vitals:   09/26/20 1221  TempSrc: Oral         Complications: No notable events documented.

## 2020-09-26 NOTE — Op Note (Signed)
09/26/2020  2:21 PM  Patient:   Suzanne Munoz  Pre-Op Diagnosis:   Secondary adhesive capsulitis, right shoulder.  Post-Op Diagnosis:   Same  Procedure:   Manipulation under anesthesia with steroid injection, right shoulder.  Surgeon:   Maryagnes Amos, MD  Assistant:   None  Anesthesia:   IV sedation  Findings:   As above. Prior to manipulation, the right shoulder could be forward flexed to 135 and abducted to 125. At 90 of abduction, the shoulder could be externally rotated to 70 and internally rotated to 50. Following manipulation, the shoulder could be forward flexed to 165, abducted to 160 and, at 90 of abduction, externally rotated to 90 and internally rotated to 65.  Complications:   None  EBL:   0 cc  Fluids:   500 cc crystalloid  TT:   None  Drains:   None  Closure:   None  Brief Clinical Note:   The patient is a 54 year old female who is now 4.5 months status post a right shoulder arthroscopy with debridement, decompression, distal clavicle excision, mini open rotator cuff repair using a dermal allograft, and mini open biceps tenodesis. Despite extensive physical therapy, the patient continues to have difficulty regaining shoulder range of motion. The patient's history and examination are consistent with adhesive capsulitis. The patient presents at this time for a manipulation under anesthesia with steroid injection of the right shoulder.  Procedure:   The patient was brought into the operating room and lain in the supine position. After adequate IV sedation was achieved, a timeout was performed to verify the correct surgical site. The right shoulder was gently manipulated in both abduction and external rotation, as well as adduction and internal rotation. Several palpable and audible pops were heard as the scar tissue released, permitting full range of motion of the shoulder. The glenohumeral joint was injected sterilely using 1 cc of Kenalog-40 and 9 cc  of 0.25% Sensorcaine with epinephrine. The patient was then awakened and returned to the recovery room in satisfactory condition after tolerating the procedure well.

## 2020-09-27 ENCOUNTER — Encounter: Payer: Self-pay | Admitting: Surgery

## 2020-09-27 NOTE — Anesthesia Postprocedure Evaluation (Signed)
Anesthesia Post Note  Patient: Suzanne Munoz  Procedure(s) Performed: MANIPULATION UNDER ANESTHESIA WITH STEROID INJECTION OF RIGHT SHOULDER (Right: Shoulder)  Patient location during evaluation: PACU Anesthesia Type: General Level of consciousness: awake and alert Pain management: pain level controlled Vital Signs Assessment: post-procedure vital signs reviewed and stable Respiratory status: spontaneous breathing, nonlabored ventilation, respiratory function stable and patient connected to nasal cannula oxygen Cardiovascular status: blood pressure returned to baseline and stable Postop Assessment: no apparent nausea or vomiting Anesthetic complications: no   No notable events documented.   Last Vitals:  Vitals:   09/26/20 1515 09/26/20 1535  BP: (!) 141/74 (!) 142/68  Pulse: 81 66  Resp: 15 14  Temp:  (!) 36.3 C  SpO2: 96% 100%    Last Pain:  Vitals:   09/26/20 1535  TempSrc: Temporal  PainSc: 0-No pain                 Lenard Simmer

## 2020-10-10 ENCOUNTER — Other Ambulatory Visit: Payer: Self-pay | Admitting: Internal Medicine

## 2020-10-10 DIAGNOSIS — Z1231 Encounter for screening mammogram for malignant neoplasm of breast: Secondary | ICD-10-CM

## 2020-11-21 ENCOUNTER — Ambulatory Visit
Admission: RE | Admit: 2020-11-21 | Discharge: 2020-11-21 | Disposition: A | Discharge status: Home / Self Care | Payer: BC Managed Care – PPO | Source: Ambulatory Visit | Attending: Internal Medicine | Admitting: Internal Medicine

## 2020-11-21 ENCOUNTER — Other Ambulatory Visit: Payer: Self-pay

## 2020-11-21 DIAGNOSIS — Z1231 Encounter for screening mammogram for malignant neoplasm of breast: Secondary | ICD-10-CM | POA: Diagnosis not present

## 2021-01-18 ENCOUNTER — Other Ambulatory Visit: Payer: Self-pay | Admitting: Physician Assistant

## 2021-01-18 ENCOUNTER — Other Ambulatory Visit: Payer: Self-pay | Admitting: Certified Registered Nurse Anesthetist

## 2021-01-18 DIAGNOSIS — R110 Nausea: Secondary | ICD-10-CM

## 2021-01-18 DIAGNOSIS — G4485 Primary stabbing headache: Secondary | ICD-10-CM

## 2021-01-24 ENCOUNTER — Ambulatory Visit
Admission: RE | Admit: 2021-01-24 | Discharge: 2021-01-24 | Disposition: A | Discharge status: Home / Self Care | Payer: BC Managed Care – PPO | Source: Ambulatory Visit | Attending: Physician Assistant | Admitting: Physician Assistant

## 2021-01-24 ENCOUNTER — Other Ambulatory Visit: Payer: Self-pay

## 2021-01-24 DIAGNOSIS — R110 Nausea: Secondary | ICD-10-CM | POA: Insufficient documentation

## 2021-01-24 DIAGNOSIS — G4485 Primary stabbing headache: Secondary | ICD-10-CM | POA: Insufficient documentation

## 2021-05-02 ENCOUNTER — Other Ambulatory Visit: Payer: Self-pay | Admitting: Family Medicine

## 2021-05-02 DIAGNOSIS — M7582 Other shoulder lesions, left shoulder: Secondary | ICD-10-CM

## 2021-05-17 ENCOUNTER — Ambulatory Visit
Admission: RE | Admit: 2021-05-17 | Discharge: 2021-05-17 | Disposition: A | Discharge status: Home / Self Care | Payer: BC Managed Care – PPO | Source: Ambulatory Visit | Attending: Family Medicine | Admitting: Family Medicine

## 2021-05-17 DIAGNOSIS — M7582 Other shoulder lesions, left shoulder: Secondary | ICD-10-CM

## 2021-11-06 ENCOUNTER — Other Ambulatory Visit: Payer: Self-pay | Admitting: Internal Medicine

## 2021-11-06 DIAGNOSIS — Z1231 Encounter for screening mammogram for malignant neoplasm of breast: Secondary | ICD-10-CM

## 2022-02-26 ENCOUNTER — Ambulatory Visit
Admission: RE | Admit: 2022-02-26 | Discharge: 2022-02-26 | Disposition: A | Discharge status: Home / Self Care | Payer: BC Managed Care – PPO | Source: Ambulatory Visit | Attending: Internal Medicine | Admitting: Internal Medicine

## 2022-02-26 DIAGNOSIS — Z1231 Encounter for screening mammogram for malignant neoplasm of breast: Secondary | ICD-10-CM | POA: Diagnosis not present

## 2022-10-07 ENCOUNTER — Other Ambulatory Visit: Payer: Self-pay | Admitting: Family Medicine

## 2022-10-07 DIAGNOSIS — M5416 Radiculopathy, lumbar region: Secondary | ICD-10-CM

## 2022-11-01 ENCOUNTER — Ambulatory Visit
Admission: RE | Admit: 2022-11-01 | Discharge: 2022-11-01 | Disposition: A | Discharge status: Home / Self Care | Payer: BC Managed Care – PPO | Source: Ambulatory Visit | Attending: Family Medicine | Admitting: Family Medicine

## 2022-11-01 DIAGNOSIS — M5416 Radiculopathy, lumbar region: Secondary | ICD-10-CM

## 2022-11-25 IMAGING — CT CT RENAL STONE PROTOCOL
2 of 4 series · 15 of 46 positions shown, 17 images · non-contrast
Comparison: CT June 23, 2012 and lumbar spine radiograph Pulley

CLINICAL DATA: Microscopic hematuria with left mid pelvic and groin
tenderness.

EXAM:
CT ABDOMEN AND PELVIS WITHOUT CONTRAST
TECHNIQUE: Multidetector CT imaging of the abdomen and pelvis was performed
following the standard protocol without IV contrast.

[Series 2: renal (person_name) 5.00 · axial · 0.75mm/px · z∈[-1477,-1057]mm · 12 of 96 slices shown, 14 images]
[im 8/96  soft-tissue]
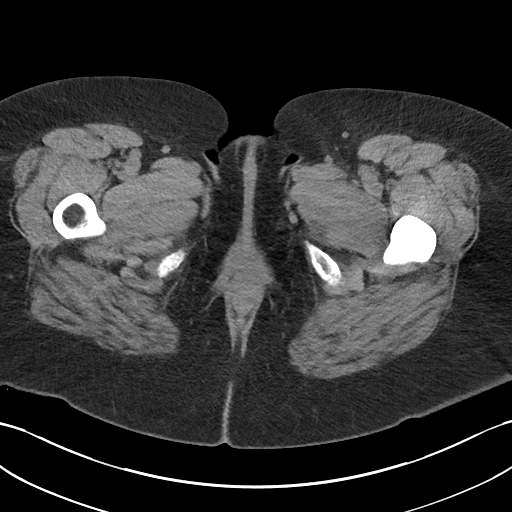
[im 8/96  bone]
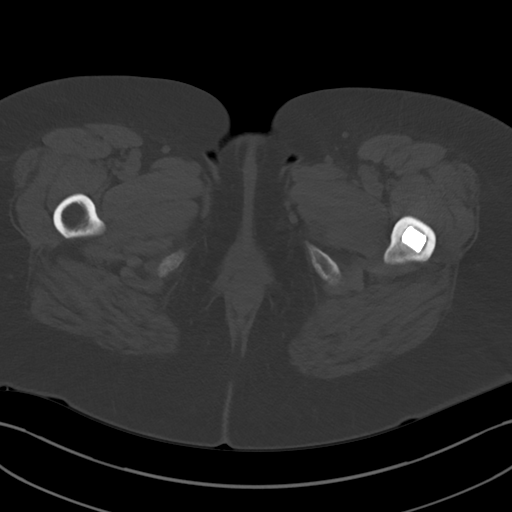
[im 16/96  soft-tissue]
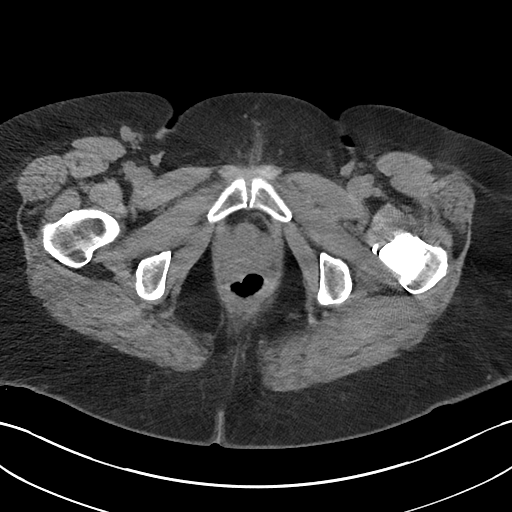
[im 23/96  soft-tissue]
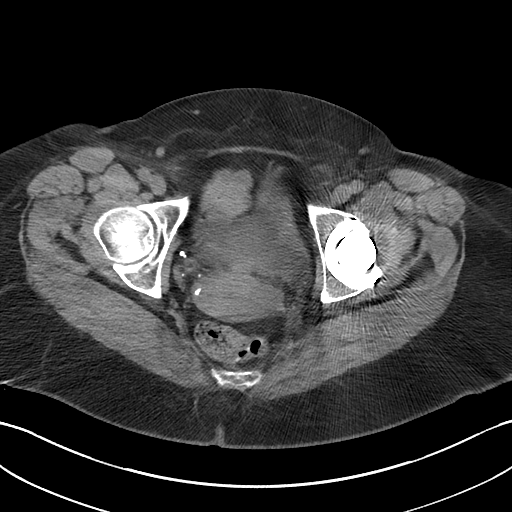
[im 31/96  soft-tissue]
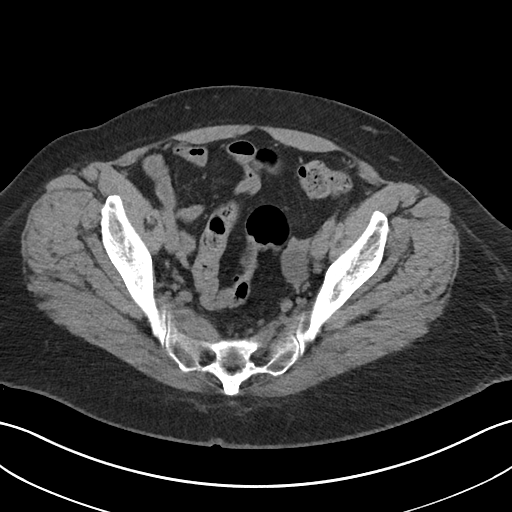
[im 39/96  soft-tissue]
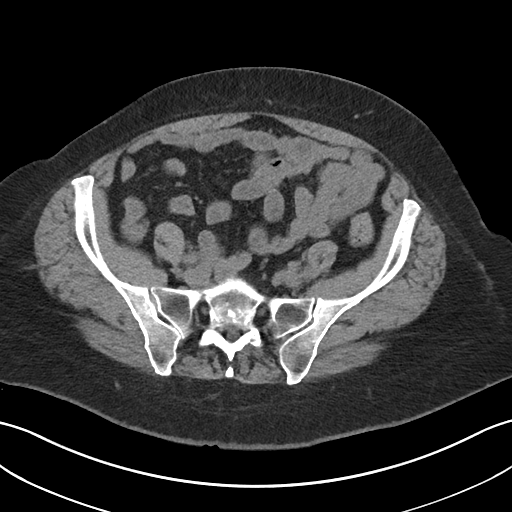
[im 46/96  soft-tissue]
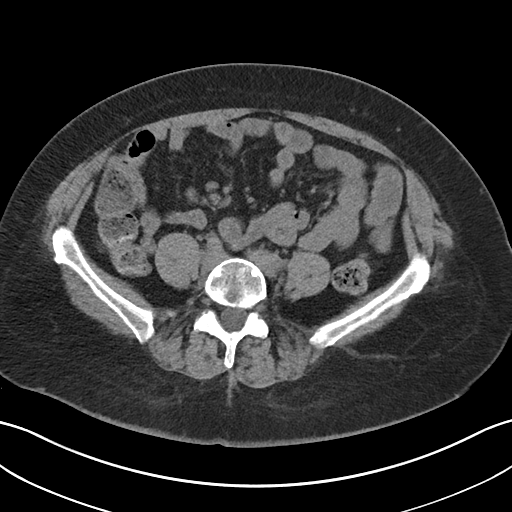
[im 54/96  soft-tissue]
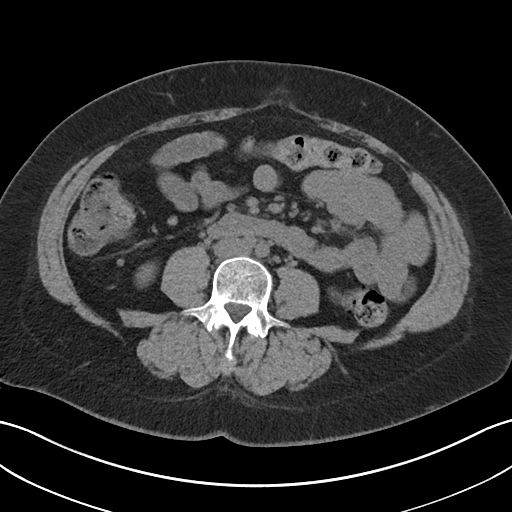
[im 61/96  soft-tissue]
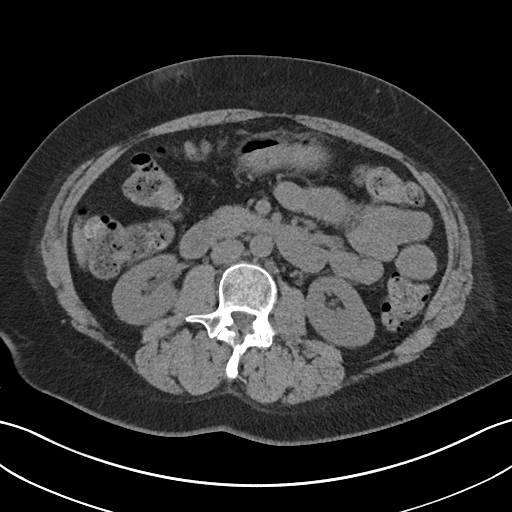
[im 69/96  soft-tissue]
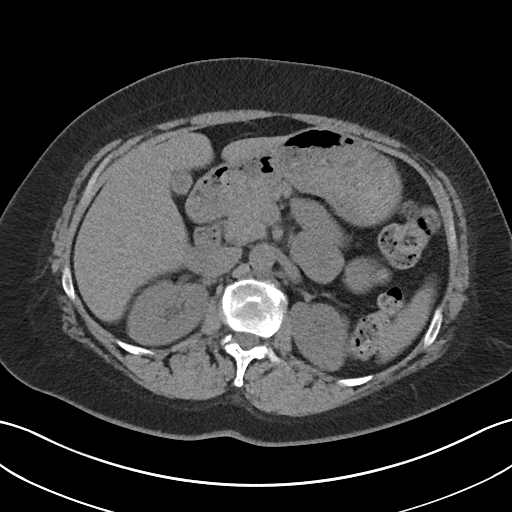
[im 69/96  bone]
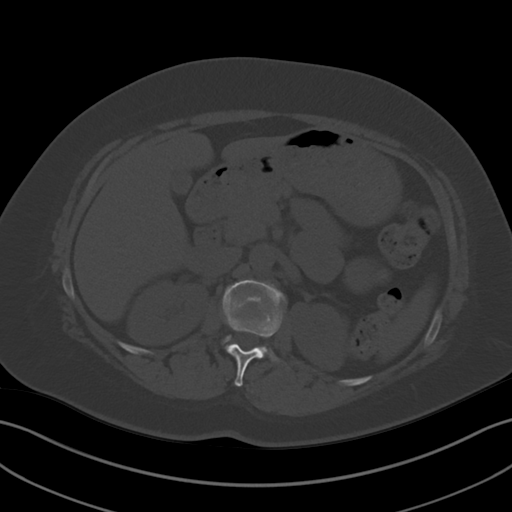
[im 77/96  soft-tissue]
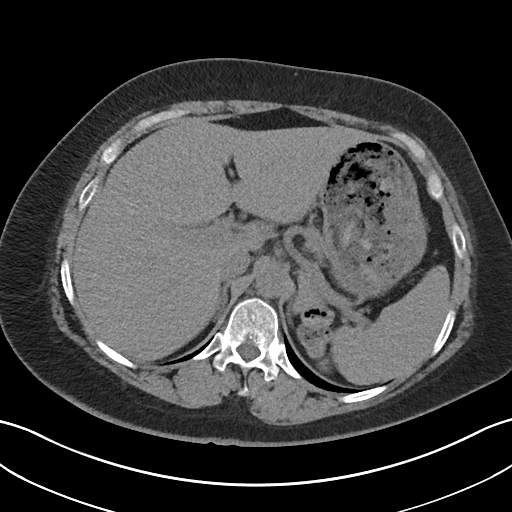
[im 84/96  soft-tissue]
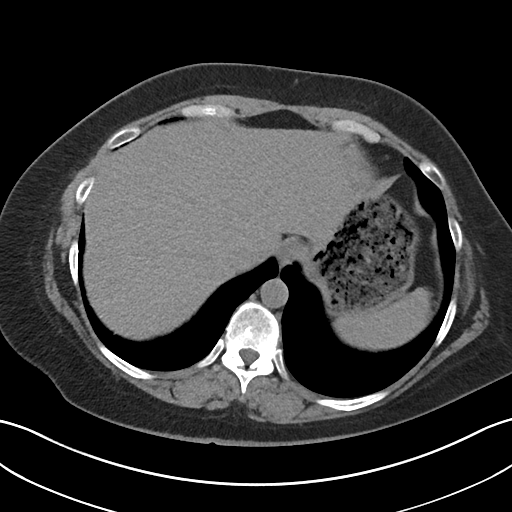
[im 92/96  soft-tissue]
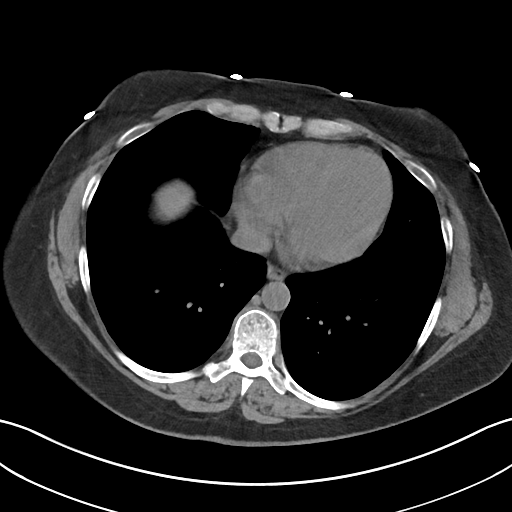

[Series 4: renal (person_name) 2.00 cor · coronal · 0.75mm/px · 3 of 143 slices shown]
[im 48/143  soft-tissue]
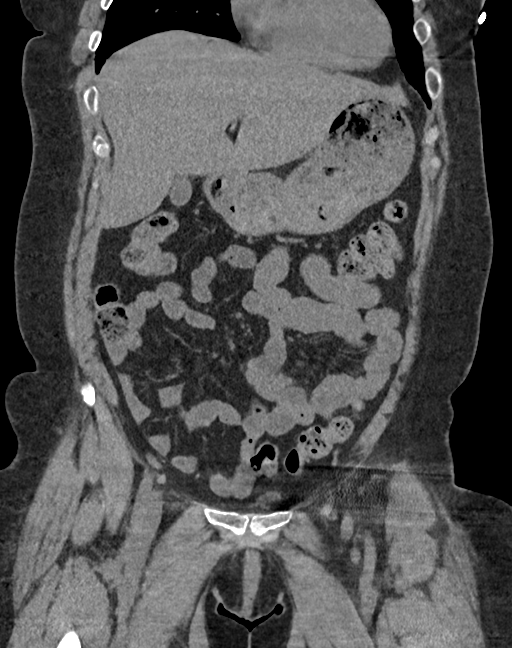
[im 64/143  soft-tissue]
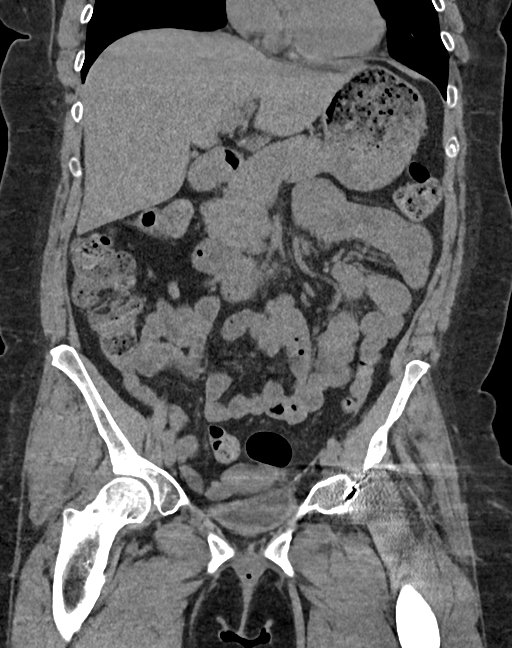
[im 79/143  soft-tissue]
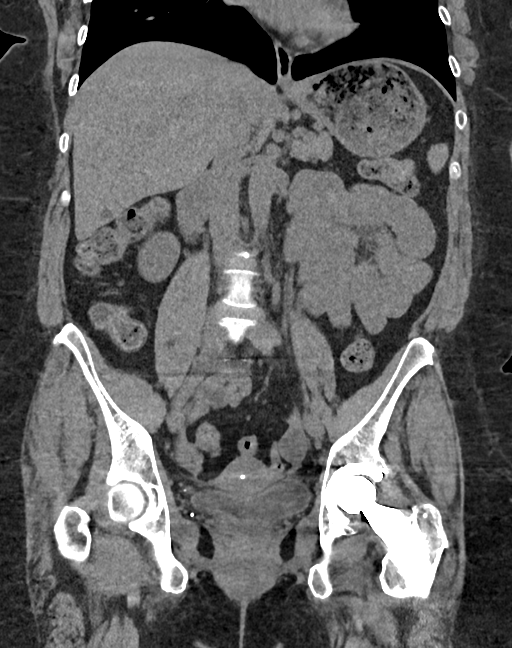

[15 of 46 positions shown; findings below may reference images not displayed]

FINDINGS: Lower chest: No acute abnormality.

Hepatobiliary: Unremarkable noncontrast appearance of the hepatic
parenchyma. Gallbladder is decompressed. No biliary ductal dilation.

Pancreas: Unremarkable noncontrast appearance the pancreatic
parenchyma. No pancreatic ductal dilation.

Spleen: Within normal limits.

Adrenals/Urinary Tract: Adrenal glands are unremarkable. Kidneys are
normal, without renal calculi, contour deforming lesion, or
hydronephrosis. Bladder is predominately decompressed limiting
evaluation.

Stomach/Bowel: Stomach is within normal limits. Appendix is not
confidently identified however there is no pericecal inflammation.
There is mild fatty infiltration of the wall of the terminal ileum.
No evidence of bowel wall thickening, distention, or inflammatory
changes.

Vascular/Lymphatic: No abdominal aortic aneurysm. Pelvic
phleboliths. No pathologically enlarged abdominal or pelvic lymph
nodes.

Reproductive: Intrauterine device which appears appropriately
position. Right ovary is unremarkable. Simple appearing 2.8 cm left
ovarian cyst. No follow-up imaging recommended. Note: This
recommendation does not apply to premenarchal patients and to those
with increased risk (genetic, family history, elevated tumor markers
or other high-risk factors) of ovarian cancer. Reference: JACR [DATE]):[id number]

Other: No abdominopelvic ascites. Small fat containing
paraumbilical. Mild soft tissue stranding in the right anterior
abdominal wall which may repeat present sequela of subcutaneous
injection or trauma.

Musculoskeletal: Left total hip arthroplasty. L5-S1 anterior spinal
fusion hardware. Similar appearance of the mild L1 compression
deformity. Multilevel degenerative changes spine. No acute osseous
abnormality.
IMPRESSION: 1. No hydronephrosis. No renal, ureteral or bladder calculi
visualized.
2. Small fat containing paraumbilical hernia.

## 2022-12-06 ENCOUNTER — Ambulatory Visit
Admission: EM | Admit: 2022-12-06 | Discharge: 2022-12-06 | Disposition: A | Discharge status: Home / Self Care | Payer: BC Managed Care – PPO | Attending: Family Medicine | Admitting: Family Medicine

## 2022-12-06 ENCOUNTER — Encounter: Payer: Self-pay | Admitting: Emergency Medicine

## 2022-12-06 DIAGNOSIS — M79662 Pain in left lower leg: Secondary | ICD-10-CM

## 2022-12-06 MED ORDER — NAPROXEN 500 MG PO TABS: 500.0000 mg | ORAL_TABLET | Freq: Two times a day (BID) | ORAL | 0 refills | Status: AC

## 2022-12-06 MED ORDER — METAXALONE 800 MG PO TABS: 800.0000 mg | ORAL_TABLET | Freq: Three times a day (TID) | ORAL | 0 refills | Status: AC

## 2022-12-06 NOTE — Discharge Instructions (Signed)
Follow up with your primary care or orthopedic provider. Stop by the pharmacy to pick up your prescriptions.

## 2022-12-06 NOTE — ED Triage Notes (Signed)
Patient states that she was at the gym last night and was doing jumping jacks and felt a pop and instant pain in her left calf muscle.

## 2022-12-06 NOTE — ED Provider Notes (Signed)
[facility]    CSN: [id number] Arrival date & time: 12/06/22  1157      History   Chief Complaint Chief Complaint  Patient presents with   Leg Pain    Left calf    HPI  HPI Suzanne Munoz is a 57 y.o. female.   Suzanne Munoz presents for left calf pain after working out in the gym last night. She was doing some junmpoing jacks durign a HIIT routine and felt a pop in the upper calf. She was able to walk. She took some pain medication and iced it.  Has pain with movement. She has been needed to furniture surface.  She tried to change clothes last night and screamed due to pain. Put some more ice on it.  She took Aleve and Tylenol. No redness, bruising or swelling. She is limping when she walks. She hasn't injuried her calf before.       ***Injury occurred, mechanism and which ***shoulder injured.  Reports *** immediate pain in his shoulder ***. Did ***not hear any pop or abnormal sounds with his injury.  Continues to have *** description pain when moving his arm forward. Denies ***redness, swelling. Does not feel like her ***arm is weak. Has tried *** with little relief.  ***No change in pain day vs night.  Has ***never injured this *** shoulder before.  ***she is ***right handed.    Fever : no  Sore throat: no   Cough: no Appetite: normal  Hydration: normal  Abdominal pain: no Nausea: no Vomiting: no Sleep disturbance: no *** Back Pain: no Headache: no     Past Medical History:  Diagnosis Date   Anemia    Asthma    Diabetes mellitus without complication (HCC)    GERD (gastroesophageal reflux disease)    Hypothyroidism    PONV (postoperative nausea and vomiting)    Thyroid disease     Patient Active Problem List   Diagnosis Date Noted   DJD of right AC (acromioclavicular) joint 05/10/2020   Nontraumatic complete tear of right rotator cuff 05/10/2020   Tendinitis of upper biceps tendon of right shoulder 05/10/2020   Burning sensation 07/07/2019    Chronic low back pain 07/07/2019   Sleeping difficulty 07/07/2019   Numbness 02/24/2019   Primary localized osteoarthritis of left hip 12/13/2015   Controlled type 2 diabetes mellitus without complication (HCC) 12/19/2014   Type 2 diabetes mellitus without complication, without long-term current use of insulin (HCC) 12/19/2014   Arthralgia of hip 10/19/2014   Left leg pain 10/19/2014   Adnexal pain 10/19/2014   Degenerative arthritis of hip 12/20/2013   Primary osteoarthritis of left hip 12/20/2013   B12 deficiency 10/19/2013   Iron deficiency 10/19/2013   Adiposity 10/19/2013   Degeneration of intervertebral disc of lumbar region 07/21/2013   Neuritis or radiculitis due to rupture of lumbar intervertebral disc 07/21/2013   Trochanteric bursitis of left hip 07/21/2013   Lumbar radiculitis 07/21/2013   DDD (degenerative disc disease), lumbar 07/21/2013   Diabetes mellitus (HCC) 05/12/2013   Brash 05/12/2013   HLD (hyperlipidemia) 05/12/2013   Adult hypothyroidism 05/12/2013   Adaptive colitis 05/12/2013   IBS (irritable bowel syndrome) 05/12/2013   Hypothyroid 05/12/2013   Heartburn 05/12/2013   Hyperlipidemia, unspecified 05/12/2013   Reflux 03/20/2011    Past Surgical History:  Procedure Laterality Date   BACK SURGERY     rupture disc 2004, fusion 2008   CLOSED MANIPULATION SHOULDER WITH STERIOD INJECTION Right 09/26/2020   Procedure: MANIPULATION  UNDER ANESTHESIA WITH STEROID INJECTION OF RIGHT SHOULDER;  Surgeon: Christena Flake, MD;  Location: [facility];  Service: Orthopedics;  Laterality: Right;   COLONOSCOPY     DG FOREARM RIGHT ([facility] HX)     plastic surgery   FOOT SURGERY     orthoscopic knee surgery     SHOULDER ARTHROSCOPY WITH SUBACROMIAL DECOMPRESSION, ROTATOR CUFF REPAIR AND BICEP TENDON REPAIR Right 05/08/2020   Procedure: SHOULDER ARTHROSCOPY WITH DEBRIDEMENT, DECOMPRESSION, DISTAL CLAVICLE EXCISION, ROTATOR CUFF REPAIR AND POSSIBLE BICEP TENODESIS.;  Surgeon:  Christena Flake, MD;  Location: [facility];  Service: Orthopedics;  Laterality: Right;   TONSILLECTOMY     TOTAL HIP ARTHROPLASTY Left 12/13/2015   Procedure: TOTAL HIP ARTHROPLASTY ANTERIOR APPROACH;  Surgeon: Kennedy Bucker, MD;  Location: [facility];  Service: Orthopedics;  Laterality: Left;    OB History   No obstetric history on file.      Home Medications    Prior to Admission medications   Medication Sig Start Date End Date Taking? Authorizing Provider  acetaminophen (TYLENOL) 500 MG tablet Take 1,000 mg by mouth every 6 (six) hours as needed for mild pain or moderate pain.    [provider]  albuterol (VENTOLIN HFA) 108 (90 Base) MCG/ACT inhaler Inhale 2 puffs into the lungs every 6 (six) hours as needed for wheezing or shortness of breath. 11/18/19   [provider]  azelastine (ASTELIN) 0.1 % nasal spray Place 1 spray into the nose 2 (two) times daily as needed for allergies. 11/23/19   [provider]  Black Cohosh 540 MG CAPS Take 540 mg by mouth 2 (two) times daily.    [provider]  fexofenadine (ALLEGRA) 180 MG tablet Take 180 mg by mouth daily.    [provider]  gabapentin (NEURONTIN) 100 MG capsule Take 200 mg by mouth 3 (three) times daily.    [provider]  hyoscyamine (LEVSIN, ANASPAZ) 0.125 MG tablet Take 0.125 mg by mouth every 4 (four) hours as needed for cramping. 11/17/14   [provider]  ibuprofen (ADVIL) 800 MG tablet Take 800 mg by mouth every 8 (eight) hours as needed for moderate pain. 06/15/20   [provider]  levonorgestrel (MIRENA) 20 MCG/24HR IUD by Intrauterine route.    [provider]  lisinopril (PRINIVIL,ZESTRIL) 5 MG tablet Take 5 mg by mouth daily. 12/24/14   [provider]  Menthol, Topical Analgesic, (BIOFREEZE EX) Apply 1 application topically daily as needed (pain).    [provider]  metFORMIN (GLUCOPHAGE-XR) 500 MG 24 hr tablet Take 1,000 mg  by mouth 2 (two) times daily. 03/14/20   [provider]  MOUNJARO 15 MG/0.5ML Pen Inject into the skin.    [provider]  Multiple Vitamin (MULTIVITAMIN WITH MINERALS) TABS tablet Take 1 tablet by mouth daily.    [provider]  ondansetron (ZOFRAN ODT) 4 MG disintegrating tablet Take 1 tablet (4 mg total) by mouth every 8 (eight) hours as needed for nausea or vomiting. 05/08/20   Poggi, Excell Seltzer, MD  OZEMPIC, 0.25 OR 0.5 MG/DOSE, 2 MG/1.5ML SOPN Inject 0.25 mg into the muscle every Saturday. 03/13/20   [provider]  pantoprazole (PROTONIX) 40 MG tablet Take 40 mg by mouth daily. 04/03/20   [provider]  simvastatin (ZOCOR) 40 MG tablet Take 40 mg by mouth at bedtime. 01/11/15   [provider]  SYNTHROID 137 MCG tablet Take 137 mcg by mouth daily before breakfast. 11/20/14  [provider]  terbinafine (LAMISIL) 250 MG tablet Take 1 tablet (250 mg total) by mouth daily. 08/07/20   Felecia Shelling, DPM    Family History Family History  Problem Relation Age of Onset   Breast cancer Maternal Aunt    Breast cancer Other     Social History Social History   Tobacco Use   Smoking status: Never   Smokeless tobacco: Never  Vaping Use   Vaping status: Never Used  Substance Use Topics   Alcohol use: Yes    Alcohol/week: 0.0 standard drinks of alcohol    Comment: 2 x mo   Drug use: No     Allergies   Amoxicillin, Desipramine, Dexlansoprazole, Oxycodone, and Rosuvastatin   Review of Systems Review of Systems: :negative unless otherwise stated in HPI.      Physical Exam Triage Vital Signs ED Triage Vitals  Encounter Vitals Group     BP 12/06/22 1247 118/68     Systolic BP Percentile --      Diastolic BP Percentile --      Pulse Rate 12/06/22 1247 60     Resp 12/06/22 1247 14     Temp 12/06/22 1247 98.1 F (36.7 C)     Temp Source 12/06/22 1247 Oral     SpO2 12/06/22 1247 100 %     Weight 12/06/22 1244 168 lb (76.2  kg)     Height 12/06/22 1244 5\' 7"  (1.702 m)     Head Circumference --      Peak Flow --      Pain Score 12/06/22 1244 7     Pain Loc --      Pain Education --      Exclude from Growth Chart --    No data found.  Updated Vital Signs BP 118/68 (BP Location: Left Arm)   Pulse 60   Temp 98.1 F (36.7 C) (Oral)   Resp 14   Ht 5\' 7"  (1.702 m)   Wt 76.2 kg   LMP  (LMP Unknown)   SpO2 100%   BMI 26.31 kg/m   Visual Acuity Right Eye Distance:   Left Eye Distance:   Bilateral Distance:    Right Eye Near:   Left Eye Near:    Bilateral Near:     Physical Exam GEN: well appearing female in no acute distress  CVS: well perfused  RESP: speaking in full sentences without pause, no respiratory distress  MSK:   *** shoulder:  No evidence of bony deformity, asymmetry, or muscle atrophy. No tenderness over long head of biceps (bicipital groove).  No TTP at [facility] joint.  Full active and passive (ABD, ADD, Flexion, extension, IR, ER). Limited *** 2/2 to pain.  Strength 5/5 grip, elbow and shoulder. No abnormal scapular function observed.  Special Tests: Juanetta Gosling: ***; Empty Can: ***, Neer's: Negative; Painful arc: Negative; Anterior Apprehension: Negative Sensation intact. Peripheral pulses intact.   UC Treatments / Results  Labs (all labs ordered are listed, but only abnormal results are displayed) Labs Reviewed - No data to display  EKG   Radiology No results found.   Procedures Procedures (including critical care time)  Medications Ordered in UC Medications - No data to display  Initial Impression / Assessment and Plan / UC Course  I have reviewed the triage vital signs and the nursing notes.  Pertinent labs & imaging results that were available during my care of the patient were reviewed by me and considered in my medical decision  making (see chart for details).      Pt is a 57 y.o.  female with *** days of *** shoulder pain after ***.   On exam, pt has  tenderness at *** concerning for ***.   Obtained *** shoulder plain films.  Personally interpreted by me were ***unremarkable for fracture or dislocation. Radiologist report reviewed and additionally notes *** no soft tissue swelling.  Given ***Toradol IM/sling/brace/crutches  Patient to gradually return to normal activities, as tolerated and continue ordinary activities within the limits permitted by pain. Prescribed Naproxen sodium *** and muscle relaxer *** for pain relief.  Tylenol PRN. Advised patient to avoid OTC NSAIDs while taking prescription NSAID. Counseled patient on red flag symptoms and when to seek immediate care.  ***No red flags such as progressive major motor weakness.   Patient to follow up with orthopedic provider, if symptoms do not improve with conservative treatment.  Return and ED precautions given. Understanding voiced. Discussed MDM, treatment plan and plan for follow-up with patient/parent who agrees with plan.   Final Clinical Impressions(s) / UC Diagnoses   Final diagnoses:  None   Discharge Instructions   None    ED Prescriptions   None    PDMP not reviewed this encounter.

## 2023-01-15 ENCOUNTER — Ambulatory Visit
Admission: RE | Admit: 2023-01-15 | Discharge: 2023-01-15 | Disposition: A | Discharge status: Home / Self Care | Payer: BC Managed Care – PPO | Source: Ambulatory Visit | Attending: Emergency Medicine | Admitting: Emergency Medicine

## 2023-01-15 VITALS — BP 159/83 | HR 75 | Temp 98.1°F

## 2023-01-15 DIAGNOSIS — J0100 Acute maxillary sinusitis, unspecified: Secondary | ICD-10-CM

## 2023-01-15 MED ORDER — PREDNISONE 10 MG (21) PO TBPK: ORAL_TABLET | Freq: Every day | ORAL | 0 refills | Status: AC

## 2023-01-15 MED ORDER — IPRATROPIUM BROMIDE 0.03 % NA SOLN: 2.0000 | Freq: Two times a day (BID) | NASAL | 0 refills | Status: AC

## 2023-01-15 MED ORDER — DOXYCYCLINE HYCLATE 100 MG PO CAPS: 100.0000 mg | ORAL_CAPSULE | Freq: Two times a day (BID) | ORAL | 0 refills | Status: AC

## 2023-01-15 NOTE — ED Triage Notes (Signed)
Pt presents to UC c/o possible sinus infection, c/o congestion, cough, started as scratchy throat over weekend into Monday. Now facial pain, pt has been taking mucinex sinus max with no relief.

## 2023-01-15 NOTE — ED Provider Notes (Signed)
[facility]    CSN: [id number] Arrival date & time: 01/15/23  1417      History   Chief Complaint Chief Complaint  Patient presents with   Cough    I think I have a sinus infection. My face and the back of my head hurt. Bad headache, congestion and a cough. - Entered by patient   Facial Pain   Nasal Congestion    HPI Suzanne Munoz is a 57 y.o. female.    Patient presents for evaluation of nasal congestion, area, sinus pain and pressure across the bilateral cheeks, radiating into the posterior of the scalp, intermittent posterior headaches, sore throat, and a nonproductive cough and wheezing present for 4 days.  Scalp has been intermittently sore to touch.  Tolerating food and liquids.  No known sick contacts.  Attempted use of Mucinex sinus max without relief.  Past Medical History:  Diagnosis Date   Anemia    Asthma    Diabetes mellitus without complication (HCC)    GERD (gastroesophageal reflux disease)    Hypothyroidism    PONV (postoperative nausea and vomiting)    Thyroid disease     Patient Active Problem List   Diagnosis Date Noted   DJD of right AC (acromioclavicular) joint 05/10/2020   Nontraumatic complete tear of right rotator cuff 05/10/2020   Tendinitis of upper biceps tendon of right shoulder 05/10/2020   Burning sensation 07/07/2019   Chronic low back pain 07/07/2019   Sleeping difficulty 07/07/2019   Numbness 02/24/2019   Primary localized osteoarthritis of left hip 12/13/2015   Controlled type 2 diabetes mellitus without complication (HCC) 12/19/2014   Type 2 diabetes mellitus without complication, without long-term current use of insulin (HCC) 12/19/2014   Arthralgia of hip 10/19/2014   Left leg pain 10/19/2014   Adnexal pain 10/19/2014   Degenerative arthritis of hip 12/20/2013   Primary osteoarthritis of left hip 12/20/2013   B12 deficiency 10/19/2013   Iron deficiency 10/19/2013   Adiposity 10/19/2013   Degeneration of  intervertebral disc of lumbar region 07/21/2013   Neuritis or radiculitis due to rupture of lumbar intervertebral disc 07/21/2013   Trochanteric bursitis of left hip 07/21/2013   Lumbar radiculitis 07/21/2013   DDD (degenerative disc disease), lumbar 07/21/2013   Diabetes mellitus (HCC) 05/12/2013   Brash 05/12/2013   HLD (hyperlipidemia) 05/12/2013   Adult hypothyroidism 05/12/2013   Adaptive colitis 05/12/2013   IBS (irritable bowel syndrome) 05/12/2013   Hypothyroid 05/12/2013   Heartburn 05/12/2013   Hyperlipidemia, unspecified 05/12/2013   Reflux 03/20/2011    Past Surgical History:  Procedure Laterality Date   BACK SURGERY     rupture disc 2004, fusion 2008   CLOSED MANIPULATION SHOULDER WITH STERIOD INJECTION Right 09/26/2020   Procedure: MANIPULATION UNDER ANESTHESIA WITH STEROID INJECTION OF RIGHT SHOULDER;  Surgeon: Christena Flake, MD;  Location: [facility];  Service: Orthopedics;  Laterality: Right;   COLONOSCOPY     DG FOREARM RIGHT ([facility] HX)     plastic surgery   FOOT SURGERY     orthoscopic knee surgery     SHOULDER ARTHROSCOPY WITH SUBACROMIAL DECOMPRESSION, ROTATOR CUFF REPAIR AND BICEP TENDON REPAIR Right 05/08/2020   Procedure: SHOULDER ARTHROSCOPY WITH DEBRIDEMENT, DECOMPRESSION, DISTAL CLAVICLE EXCISION, ROTATOR CUFF REPAIR AND POSSIBLE BICEP TENODESIS.;  Surgeon: Christena Flake, MD;  Location: [facility];  Service: Orthopedics;  Laterality: Right;   TONSILLECTOMY     TOTAL HIP ARTHROPLASTY Left 12/13/2015   Procedure: TOTAL HIP ARTHROPLASTY ANTERIOR APPROACH;  Surgeon: Kennedy Bucker, MD;  Location: [facility];  Service: Orthopedics;  Laterality: Left;    OB History   No obstetric history on file.      Home Medications    Prior to Admission medications   Medication Sig Start Date End Date Taking? Authorizing Provider  acetaminophen (TYLENOL) 500 MG tablet Take 1,000 mg by mouth every 6 (six) hours as needed for mild pain or moderate pain.    [provider]  albuterol (VENTOLIN HFA) 108 (90 Base) MCG/ACT inhaler Inhale 2 puffs into the lungs every 6 (six) hours as needed for wheezing or shortness of breath. 11/18/19   [provider]  azelastine (ASTELIN) 0.1 % nasal spray Place 1 spray into the nose 2 (two) times daily as needed for allergies. 11/23/19   [provider]  Black Cohosh 540 MG CAPS Take 540 mg by mouth 2 (two) times daily.    [provider]  fexofenadine (ALLEGRA) 180 MG tablet Take 180 mg by mouth daily.    [provider]  gabapentin (NEURONTIN) 100 MG capsule Take 200 mg by mouth 3 (three) times daily.    [provider]  hyoscyamine (LEVSIN, ANASPAZ) 0.125 MG tablet Take 0.125 mg by mouth every 4 (four) hours as needed for cramping. 11/17/14   [provider]  ibuprofen (ADVIL) 800 MG tablet Take 800 mg by mouth every 8 (eight) hours as needed for moderate pain. 06/15/20   [provider]  levonorgestrel (MIRENA) 20 MCG/24HR IUD by Intrauterine route.    [provider]  lisinopril (PRINIVIL,ZESTRIL) 5 MG tablet Take 5 mg by mouth daily. 12/24/14   [provider]  Menthol, Topical Analgesic, (BIOFREEZE EX) Apply 1 application topically daily as needed (pain).    [provider]  metaxalone (SKELAXIN) 800 MG tablet Take 1 tablet (800 mg total) by mouth 3 (three) times daily. 12/06/22   Brimage, Seward Meth, DO  metFORMIN (GLUCOPHAGE-XR) 500 MG 24 hr tablet Take 1,000 mg by mouth 2 (two) times daily. 03/14/20   [provider]  MOUNJARO 15 MG/0.5ML Pen Inject into the skin.    [provider]  Multiple Vitamin (MULTIVITAMIN WITH MINERALS) TABS tablet Take 1 tablet by mouth daily.    [provider]  naproxen (NAPROSYN) 500 MG tablet Take 1 tablet (500 mg total) by mouth 2 (two) times daily with a meal. 12/06/22   Brimage, Vondra, DO  ondansetron (ZOFRAN ODT) 4 MG disintegrating tablet Take 1 tablet (4 mg total) by mouth every  8 (eight) hours as needed for nausea or vomiting. 05/08/20   Poggi, Excell Seltzer, MD  OZEMPIC, 0.25 OR 0.5 MG/DOSE, 2 MG/1.5ML SOPN Inject 0.25 mg into the muscle every Saturday. 03/13/20   [provider]  pantoprazole (PROTONIX) 40 MG tablet Take 40 mg by mouth daily. 04/03/20   [provider]  simvastatin (ZOCOR) 40 MG tablet Take 40 mg by mouth at bedtime. 01/11/15   [provider]  SYNTHROID 137 MCG tablet Take 137 mcg by mouth daily before breakfast. 11/20/14   [provider]  terbinafine (LAMISIL) 250 MG tablet Take 1 tablet (250 mg total) by mouth daily. 08/07/20   Felecia Shelling, DPM    Family History Family History  Problem Relation Age of Onset   Breast cancer Maternal Aunt    Breast cancer Other     Social History Social History   Tobacco Use   Smoking status: Never   Smokeless tobacco: Never  Vaping Use  Vaping status: Never Used  Substance Use Topics   Alcohol use: Yes    Alcohol/week: 0.0 standard drinks of alcohol    Comment: 2 x mo   Drug use: No     Allergies   Amoxicillin, Desipramine, Dexlansoprazole, Meloxicam, Oxycodone, and Rosuvastatin   Review of Systems Review of Systems  Constitutional: Negative.   HENT:  Positive for congestion, rhinorrhea, sinus pressure, sinus pain and sore throat. Negative for dental problem, drooling, ear discharge, ear pain, facial swelling, hearing loss, mouth sores, nosebleeds, postnasal drip, sneezing, tinnitus, trouble swallowing and voice change.   Respiratory:  Positive for cough and wheezing. Negative for apnea, choking, chest tightness, shortness of breath and stridor.   Cardiovascular: Negative.   Gastrointestinal: Negative.   Skin: Negative.   Neurological:  Positive for headaches. Negative for dizziness, tremors, seizures, syncope, facial asymmetry, speech difficulty, weakness, light-headedness and numbness.     Physical Exam Triage Vital Signs ED Triage Vitals [01/15/23 1504]   Encounter Vitals Group     BP (!) 159/83     Systolic BP Percentile      Diastolic BP Percentile      Pulse Rate 75     Resp      Temp 98.1 F (36.7 C)     Temp Source Oral     SpO2 98 %     Weight      Height      Head Circumference      Peak Flow      Pain Score 5     Pain Loc      Pain Education      Exclude from Growth Chart    No data found.  Updated Vital Signs BP (!) 159/83 (BP Location: Right Arm)   Pulse 75   Temp 98.1 F (36.7 C) (Oral)   LMP  (LMP Unknown)   SpO2 98%   Visual Acuity Right Eye Distance:   Left Eye Distance:   Bilateral Distance:    Right Eye Near:   Left Eye Near:    Bilateral Near:     Physical Exam Constitutional:      Appearance: Normal appearance.  HENT:     Head: Normocephalic.     Right Ear: Tympanic membrane, ear canal and external ear normal.     Left Ear: Tympanic membrane, ear canal and external ear normal.     Nose: Congestion and rhinorrhea present.     Mouth/Throat:     Pharynx: No oropharyngeal exudate or posterior oropharyngeal erythema.  Eyes:     Extraocular Movements: Extraocular movements intact.  Cardiovascular:     Rate and Rhythm: Normal rate and regular rhythm.     Pulses: Normal pulses.     Heart sounds: Normal heart sounds.  Pulmonary:     Effort: Pulmonary effort is normal.     Breath sounds: Normal breath sounds.  Musculoskeletal:        General: Normal range of motion.     Cervical back: Normal range of motion and neck supple.  Skin:    General: Skin is warm and dry.  Neurological:     Mental Status: She is alert and oriented to person, place, and time. Mental status is at baseline.      UC Treatments / Results  Labs (all labs ordered are listed, but only abnormal results are displayed) Labs Reviewed - No data to display  EKG   Radiology No results found.  Procedures Procedures (including critical care time)  Medications Ordered in UC Medications - No data to display  Initial  Impression / Assessment and Plan / UC Course  I have reviewed the triage vital signs and the nursing notes.  Pertinent labs & imaging results that were available during my care of the patient were reviewed by me and considered in my medical decision making (see chart for details).  Acute nonrecurrent maxillary sinusitis  Patient is in no signs of distress nor toxic appearing.  Vital signs are stable.  Low suspicion for pneumonia, pneumothorax or bronchitis and therefore will defer imaging.  Prescribed prednisone and ipratropium nasal spray and recommended continued use of over-the-counter medications and watchful wait antibiotic placed at pharmacy if no improvement seen by day 10, doxycycline prescribed.  May follow-up with urgent care as needed if symptoms persist or worsen.  Final Clinical Impressions(s) / UC Diagnoses   Final diagnoses:  None   Discharge Instructions   None    ED Prescriptions   None    PDMP not reviewed this encounter.   Valinda Hoar, [place] 01/19/23 [phone]

## 2023-01-15 NOTE — Discharge Instructions (Signed)
Your symptoms today are most likely being caused by a virus and should steadily improve in time it can take up to 7 to 10 days before you truly start to see a turnaround however things will get better if no improvement seen by December 18 may pick up doxycycline from the pharmacy begin use serial coverage  In the meantime begin prednisone every morning with food as directed to help reduce sinus pressure  Use nasal spray every morning and every evening to further help clear out congestion  May continue use of Mucinex sinus as well as Tylenol and attempt any of the following below    You can take Tylenol and/or Ibuprofen as needed for fever reduction and pain relief.   For cough: honey 1/2 to 1 teaspoon (you can dilute the honey in water or another fluid).  You can also use guaifenesin and dextromethorphan for cough. You can use a humidifier for chest congestion and cough.  If you don't have a humidifier, you can sit in the bathroom with the hot shower running.      For sore throat: try warm salt water gargles, cepacol lozenges, throat spray, warm tea or water with lemon/honey, popsicles or ice, or OTC cold relief medicine for throat discomfort.   For congestion: take a daily anti-histamine like Zyrtec, Claritin, and a oral decongestant, such as pseudoephedrine.  You can also use Flonase 1-2 sprays in each nostril daily.   It is important to stay hydrated: drink plenty of fluids (water, gatorade/powerade/pedialyte, juices, or teas) to keep your throat moisturized and help further relieve irritation/discomfort.

## 2023-03-30 ENCOUNTER — Other Ambulatory Visit: Payer: Self-pay | Admitting: Internal Medicine

## 2023-03-30 DIAGNOSIS — Z1231 Encounter for screening mammogram for malignant neoplasm of breast: Secondary | ICD-10-CM

## 2023-04-24 ENCOUNTER — Ambulatory Visit
Admission: RE | Admit: 2023-04-24 | Discharge: 2023-04-24 | Disposition: A | Discharge status: Home / Self Care | Source: Ambulatory Visit | Attending: Internal Medicine | Admitting: Internal Medicine

## 2023-04-24 DIAGNOSIS — Z1231 Encounter for screening mammogram for malignant neoplasm of breast: Secondary | ICD-10-CM | POA: Diagnosis present

## 2023-09-08 ENCOUNTER — Ambulatory Visit: Admitting: Podiatry

## 2023-09-08 DIAGNOSIS — B999 Unspecified infectious disease: Secondary | ICD-10-CM

## 2023-09-08 MED ORDER — TERBINAFINE HCL 250 MG PO TABS: 250.0000 mg | ORAL_TABLET | Freq: Every day | ORAL | 0 refills | Status: AC

## 2023-09-08 MED ORDER — CLOTRIMAZOLE-BETAMETHASONE 1-0.05 % EX CREA: 1.0000 | TOPICAL_CREAM | Freq: Every day | CUTANEOUS | 0 refills | Status: AC

## 2023-09-08 MED ORDER — DOXYCYCLINE HYCLATE 100 MG PO TABS: 100.0000 mg | ORAL_TABLET | Freq: Two times a day (BID) | ORAL | 0 refills | Status: AC

## 2023-09-08 NOTE — Progress Notes (Signed)
 Subjective:  Patient ID: Suzanne Munoz, female    DOB: 1965-05-20,  MRN: [id number]  Chief Complaint  Patient presents with   Toe Pain    Left foot 5th toe place under the toe little white dot pt stated that it is itchy and sore     58 y.o. female presents with the above complaint.  Patient presents with left fifth digit superinfection.  She states that there is a little discoloration.  She wanted to get it evaluated has not seen it was prior to seeing me.  She is a diabetic.  She states that is causing her some itchiness and soreness.  She would like to discuss treatment options for it.  It is causing her a lot of discomfort her pain scale is 5 out of 10 dull aching nature  Review of Systems: Negative except as noted in the HPI. Denies N/V/F/Ch.  Past Medical History:  Diagnosis Date   Anemia    Asthma    Diabetes mellitus without complication (HCC)    GERD (gastroesophageal reflux disease)    Hypothyroidism    PONV (postoperative nausea and vomiting)    Thyroid disease     Current Outpatient Medications:    clotrimazole -betamethasone  (LOTRISONE ) cream, Apply 1 Application topically daily., Disp: 30 g, Rfl: 0   doxycycline  (VIBRA -TABS) 100 MG tablet, Take 1 tablet (100 mg total) by mouth 2 (two) times daily., Disp: 28 tablet, Rfl: 0   terbinafine  (LAMISIL ) 250 MG tablet, Take 1 tablet (250 mg total) by mouth daily., Disp: 14 tablet, Rfl: 0   acetaminophen  (TYLENOL ) 500 MG tablet, Take 1,000 mg by mouth every 6 (six) hours as needed for mild pain or moderate pain., Disp: , Rfl:    albuterol  (VENTOLIN  HFA) 108 (90 Base) MCG/ACT inhaler, Inhale 2 puffs into the lungs every 6 (six) hours as needed for wheezing or shortness of breath., Disp: , Rfl:    azelastine (ASTELIN) 0.1 % nasal spray, Place 1 spray into the nose 2 (two) times daily as needed for allergies., Disp: , Rfl:    Black Cohosh 540 MG CAPS, Take 540 mg by mouth 2 (two) times daily., Disp: , Rfl:    doxycycline   (VIBRAMYCIN ) 100 MG capsule, Take 1 capsule (100 mg total) by mouth 2 (two) times daily., Disp: 20 capsule, Rfl: 0   fexofenadine (ALLEGRA) 180 MG tablet, Take 180 mg by mouth daily., Disp: , Rfl:    gabapentin (NEURONTIN) 100 MG capsule, Take 200 mg by mouth 3 (three) times daily., Disp: , Rfl:    hyoscyamine (LEVSIN, ANASPAZ) 0.125 MG tablet, Take 0.125 mg by mouth every 4 (four) hours as needed for cramping., Disp: , Rfl: 3   ibuprofen (ADVIL) 800 MG tablet, Take 800 mg by mouth every 8 (eight) hours as needed for moderate pain., Disp: , Rfl:    ipratropium (ATROVENT ) 0.03 % nasal spray, Place 2 sprays into both nostrils every 12 (twelve) hours., Disp: 30 mL, Rfl: 0   levonorgestrel (MIRENA) 20 MCG/24HR IUD, by Intrauterine route., Disp: , Rfl:    lisinopril  (PRINIVIL ,ZESTRIL ) 5 MG tablet, Take 5 mg by mouth daily., Disp: , Rfl: 1   Menthol , Topical Analgesic, (BIOFREEZE EX), Apply 1 application topically daily as needed (pain)., Disp: , Rfl:    metaxalone  (SKELAXIN ) 800 MG tablet, Take 1 tablet (800 mg total) by mouth 3 (three) times daily., Disp: 30 tablet, Rfl: 0   metFORMIN  (GLUCOPHAGE -XR) 500 MG 24 hr tablet, Take 1,000 mg by mouth 2 (two) times daily.,  Disp: , Rfl:    MOUNJARO 15 MG/0.5ML Pen, Inject into the skin., Disp: , Rfl:    Multiple Vitamin (MULTIVITAMIN WITH MINERALS) TABS tablet, Take 1 tablet by mouth daily., Disp: , Rfl:    naproxen  (NAPROSYN ) 500 MG tablet, Take 1 tablet (500 mg total) by mouth 2 (two) times daily with a meal., Disp: 30 tablet, Rfl: 0   ondansetron  (ZOFRAN  ODT) 4 MG disintegrating tablet, Take 1 tablet (4 mg total) by mouth every 8 (eight) hours as needed for nausea or vomiting., Disp: 20 tablet, Rfl: 1   OZEMPIC, 0.25 OR 0.5 MG/DOSE, 2 MG/1.5ML SOPN, Inject 0.25 mg into the muscle every Saturday., Disp: , Rfl:    pantoprazole (PROTONIX) 40 MG tablet, Take 40 mg by mouth daily., Disp: , Rfl:    predniSONE  (STERAPRED UNI-PAK 21 TAB) 10 MG (21) TBPK tablet, Take  by mouth daily. Take 6 tabs by mouth daily  for 1 days, then 5 tabs for 1 days, then 4 tabs for 1 days, then 3 tabs for 1 days, 2 tabs for 1 days, then 1 tab by mouth daily for 1 days, Disp: 21 tablet, Rfl: 0   simvastatin  (ZOCOR ) 40 MG tablet, Take 40 mg by mouth at bedtime., Disp: , Rfl: 3   SYNTHROID  137 MCG tablet, Take 137 mcg by mouth daily before breakfast., Disp: , Rfl: 4   terbinafine  (LAMISIL ) 250 MG tablet, Take 1 tablet (250 mg total) by mouth daily., Disp: 90 tablet, Rfl: 0  Social History   Tobacco Use  Smoking Status Never  Smokeless Tobacco Never    Allergies  Allergen Reactions   Amoxicillin Itching   Desipramine Other (See Comments)    Sweats   Dexlansoprazole Other (See Comments)    Increase BG   Meloxicam      Other Reaction(s): Other (See Comments)  Redness in face lasting about 3 days   Oxycodone  Nausea And Vomiting   Rosuvastatin Other (See Comments)    Heartburn   Objective:  There were no vitals filed for this visit. There is no height or weight on file to calculate BMI. Constitutional Well developed. Well nourished.  Vascular Dorsalis pedis pulses palpable bilaterally. Posterior tibial pulses palpable bilaterally. Capillary refill normal to all digits.  No cyanosis or clubbing noted. Pedal hair growth normal.  Neurologic Normal speech. Oriented to person, place, and time. Epicritic sensation to light touch grossly present bilaterally.  Dermatologic Left fifth digit skin epidermolysis noted with subjective complaint of itching.  Small pustular lesion noted.  No clinical signs of infection noted no erythema or cellulitis noted.  No purulent drainage noted.  Orthopedic: Normal joint ROM without pain or crepitus bilaterally. No visible deformities. No bony tenderness.   Radiographs: None Assessment:   1. Superinfection    Plan:  Patient was evaluated and treated and all questions answered.  Left fifth digit superinfection - All questions and  concerns were discussed with the patient in extensive detail given the superinfection is present should benefit from Lamisil  for 14 days with doxycycline  for 14 days as well.  I encouraged her to take all of her medications she states understanding - If there is no resolve we will discuss other treatment options.  Patient is in agreement. - Lotrisone  cream was sent to the pharmacy.  I encouraged her to apply twice a day she states understanding

## 2023-09-09 ENCOUNTER — Telehealth: Payer: Self-pay | Admitting: Podiatry

## 2023-09-09 NOTE — Telephone Encounter (Signed)
 Patient saw Dr Tobie yesterday. She called to say her foot has gotten worse and she has a sore that is bigger. She is concerned and would like for someone to call her so she can see what she needs to do.

## 2023-09-09 NOTE — Telephone Encounter (Signed)
 Spoke with patient relayed message and has understanding.

## 2023-09-23 ENCOUNTER — Telehealth: Payer: Self-pay

## 2023-09-23 MED ORDER — DOXYCYCLINE HYCLATE 100 MG PO TABS: 100.0000 mg | ORAL_TABLET | Freq: Two times a day (BID) | ORAL | 0 refills | Status: AC

## 2023-09-23 NOTE — Telephone Encounter (Signed)
 Patient called and left a message. She finished her oral antibiotics yesterday. She is 95% better. Still has one small opening. She is still using the topical. She just wanted to give you and update and see if you think she needs another round of oral antibiotics. thanks

## 2023-09-23 NOTE — Addendum Note (Signed)
 Addended by: Billie Intriago on: 09/23/2023 01:41 PM   Modules accepted: Orders

## 2024-02-23 ENCOUNTER — Other Ambulatory Visit: Payer: Self-pay | Admitting: Student

## 2024-02-23 DIAGNOSIS — G8929 Other chronic pain: Secondary | ICD-10-CM

## 2024-02-23 DIAGNOSIS — M7582 Other shoulder lesions, left shoulder: Secondary | ICD-10-CM

## 2024-02-23 DIAGNOSIS — M7522 Bicipital tendinitis, left shoulder: Secondary | ICD-10-CM

## 2024-02-26 ENCOUNTER — Ambulatory Visit
Admission: RE | Admit: 2024-02-26 | Discharge: 2024-02-26 | Disposition: A | Source: Ambulatory Visit | Attending: Student

## 2024-02-26 DIAGNOSIS — M7582 Other shoulder lesions, left shoulder: Secondary | ICD-10-CM | POA: Insufficient documentation

## 2024-02-26 DIAGNOSIS — M25512 Pain in left shoulder: Secondary | ICD-10-CM | POA: Diagnosis present

## 2024-02-26 DIAGNOSIS — M7522 Bicipital tendinitis, left shoulder: Secondary | ICD-10-CM | POA: Diagnosis present

## 2024-02-26 DIAGNOSIS — G8929 Other chronic pain: Secondary | ICD-10-CM | POA: Diagnosis present
